# Patient Record
Sex: Male | Born: 1985
Health system: Southern US, Community
[De-identification: ages and names within clinical notes are randomized; demographics above are authoritative.]

## PROBLEM LIST (undated history)

## (undated) DIAGNOSIS — I639 Cerebral infarction, unspecified: Secondary | ICD-10-CM

## (undated) HISTORY — PX: FRACTURE SURGERY: SHX138

---

## 2017-11-14 ENCOUNTER — Encounter (HOSPITAL_COMMUNITY): Payer: Self-pay

## 2017-11-14 ENCOUNTER — Ambulatory Visit (HOSPITAL_COMMUNITY)
Admission: EM | Admit: 2017-11-14 | Discharge: 2017-11-14 | Disposition: A | Discharge status: Home / Self Care | Payer: Self-pay | Attending: Family Medicine | Admitting: Family Medicine

## 2017-11-14 DIAGNOSIS — F17210 Nicotine dependence, cigarettes, uncomplicated: Secondary | ICD-10-CM | POA: Insufficient documentation

## 2017-11-14 DIAGNOSIS — R369 Urethral discharge, unspecified: Secondary | ICD-10-CM | POA: Insufficient documentation

## 2017-11-14 DIAGNOSIS — Z202 Contact with and (suspected) exposure to infections with a predominantly sexual mode of transmission: Secondary | ICD-10-CM | POA: Insufficient documentation

## 2017-11-14 MED ORDER — LIDOCAINE HCL (PF) 1 % IJ SOLN
INTRAMUSCULAR | Status: AC
  Filled 2017-11-14: qty 2

## 2017-11-14 MED ORDER — AZITHROMYCIN 250 MG PO TABS
ORAL_TABLET | ORAL | Status: AC
  Filled 2017-11-14: qty 4

## 2017-11-14 MED ORDER — CEFTRIAXONE SODIUM 250 MG IJ SOLR
INTRAMUSCULAR | Status: AC
  Filled 2017-11-14: qty 250

## 2017-11-14 MED ORDER — AZITHROMYCIN 250 MG PO TABS
1000.0000 mg | ORAL_TABLET | Freq: Once | ORAL | Status: AC
Start: 2017-11-14 — End: 2017-11-14
  Administered 2017-11-14: 1000 mg via ORAL

## 2017-11-14 MED ORDER — CEFTRIAXONE SODIUM 250 MG IJ SOLR
250.0000 mg | Freq: Once | INTRAMUSCULAR | Status: AC
  Administered 2017-11-14: 250 mg via INTRAMUSCULAR

## 2017-11-14 NOTE — ED Provider Notes (Signed)
[facility]    CSN: [id number] Arrival date & time: 11/14/17  1811     History   Chief Complaint Chief Complaint  Patient presents with  . Exposure to STD    HPI Curtis Beltran is a 32 y.o. male.   32 year old male comes in for penile discharge and STD screening.  States he has a new partner, and this morning noticed some penile discharge with "tingling" after urination.  Denies fever, chills, night sweats.  Denies abdominal pain, nausea, vomiting.  Denies urinary frequency, hematuria.  Denies penile lesion/sore, testicular swelling, pain.     History reviewed. No pertinent past medical history.  There are no active problems to display for this patient.   History reviewed. No pertinent surgical history.     Home Medications    Prior to Admission medications   Not on File    Family History Family History  Problem Relation Age of Onset  . Healthy Mother     Social History Social History   Tobacco Use  . Smoking status: Light Tobacco Smoker  . Smokeless tobacco: Never Used  Substance Use Topics  . Alcohol use: Never    Frequency: Never  . Drug use: Never     Allergies   Patient has no allergy information on record.   Review of Systems Review of Systems  Reason unable to perform ROS: See HPI as above.     Physical Exam Triage Vital Signs ED Triage Vitals  Enc Vitals Group     BP 11/14/17 1832 (!) 140/97     Pulse Rate 11/14/17 1832 70     Resp 11/14/17 1832 19     Temp 11/14/17 1832 98.2 F (36.8 C)     Temp Source 11/14/17 1832 Oral     SpO2 11/14/17 1832 100 %     Weight --      Height --      Head Circumference --      Peak Flow --      Pain Score 11/14/17 1829 2     Pain Loc --      Pain Edu? --      Excl. in GC? --    No data found.  Updated Vital Signs BP (!) 140/97 (BP Location: Right Arm)   Pulse 70   Temp 98.2 F (36.8 C) (Oral)   Resp 19   SpO2 100%   Physical Exam  Constitutional: He is oriented to  person, place, and time. He appears well-developed and well-nourished. No distress.  HENT:  Head: Normocephalic and atraumatic.  Eyes: Pupils are equal, round, and reactive to light. Conjunctivae are normal.  Neurological: He is alert and oriented to person, place, and time.  Skin: He is not diaphoretic.     UC Treatments / Results  Labs (all labs ordered are listed, but only abnormal results are displayed) Labs Reviewed  URINE CYTOLOGY ANCILLARY ONLY    EKG None  Radiology No results found.  Procedures Procedures (including critical care time)  Medications Ordered in UC Medications  azithromycin (ZITHROMAX) tablet 1,000 mg (1,000 mg Oral Given 11/14/17 1921)  cefTRIAXone (ROCEPHIN) injection 250 mg (250 mg Intramuscular Given 11/14/17 1927)    Initial Impression / Assessment and Plan / UC Course  I have reviewed the triage vital signs and the nursing notes.  Pertinent labs & imaging results that were available during my care of the patient were reviewed by me and considered in my medical decision making (see chart for  details).    Patient was treated empirically for gonorrhea, chlamydia. Patient declined HIV/syphilis testing. Azithromycin and Rocephin given in office today. Cytology sent, patient will be contacted with any positive results that require additional treatment. Patient to refrain from sexual activity for the next 7 days. Return precautions given.   Final Clinical Impressions(s) / UC Diagnoses   Final diagnoses:  Penile discharge    ED Prescriptions    None        Belinda FisherYu, Amy V, PA-C 11/14/17 2002

## 2017-11-14 NOTE — Discharge Instructions (Signed)
You were treated empirically for gonorrhea and chlamydia.  Azithromycin 1g by mouth and Rocephin 250mg  injection given in office today. Cytology sent, you will be contacted with any positive results that requires further treatment. Refrain from sexual activity and alcohol use for the next 7 days. Monitor for any worsening of symptoms, fever, abdominal pain, nausea, vomiting, penile lesion/sore, testicular swelling or pain to follow up for reevaluation.

## 2017-11-14 NOTE — ED Triage Notes (Signed)
STD Screening

## 2017-11-15 LAB — URINE CYTOLOGY ANCILLARY ONLY
Chlamydia: NEGATIVE
Neisseria Gonorrhea: POSITIVE — AB
TRICH (WINDOWPATH): NEGATIVE

## 2017-11-17 ENCOUNTER — Telehealth (HOSPITAL_COMMUNITY): Payer: Self-pay

## 2017-11-17 NOTE — Telephone Encounter (Signed)
Test for gonorrhea was positive. This was treated at the urgent care visit with IM rocephin 250mg and po zithromax 1g. Pt called regarding test results, instructed patient to refrain from sexual intercourse for 7 days after treatment to give the medicine time to work. Sexual partners need to be notified and tested/treated. Condoms may reduce risk of reinfection. Recheck or followup with PCP for further evaluation if symptoms are not improving. Answered all patient questions. [facility] notified.  

## 2020-03-02 ENCOUNTER — Inpatient Hospital Stay (HOSPITAL_COMMUNITY)
Admission: EM | Admit: 2020-03-02 | Discharge: 2020-03-05 | DRG: 062 | Disposition: A | Discharge status: Home / Self Care | Payer: Self-pay | Attending: Neurology | Admitting: Neurology

## 2020-03-02 ENCOUNTER — Encounter (HOSPITAL_COMMUNITY): Payer: Self-pay | Admitting: Radiology

## 2020-03-02 ENCOUNTER — Emergency Department (HOSPITAL_COMMUNITY): Payer: Self-pay

## 2020-03-02 ENCOUNTER — Encounter (HOSPITAL_COMMUNITY)
Admission: EM | Disposition: A | Discharge status: Home / Self Care | Payer: Self-pay | Source: Home / Self Care | Attending: Neurology

## 2020-03-02 ENCOUNTER — Inpatient Hospital Stay (HOSPITAL_COMMUNITY): Payer: Self-pay

## 2020-03-02 ENCOUNTER — Emergency Department (HOSPITAL_COMMUNITY): Payer: Self-pay | Admitting: Anesthesiology

## 2020-03-02 DIAGNOSIS — G8194 Hemiplegia, unspecified affecting left nondominant side: Secondary | ICD-10-CM | POA: Diagnosis present

## 2020-03-02 DIAGNOSIS — Z20822 Contact with and (suspected) exposure to covid-19: Secondary | ICD-10-CM | POA: Diagnosis present

## 2020-03-02 DIAGNOSIS — I10 Essential (primary) hypertension: Secondary | ICD-10-CM | POA: Diagnosis present

## 2020-03-02 DIAGNOSIS — F1290 Cannabis use, unspecified, uncomplicated: Secondary | ICD-10-CM | POA: Diagnosis present

## 2020-03-02 DIAGNOSIS — Z9282 Status post administration of tPA (rtPA) in a different facility within the last 24 hours prior to admission to current facility: Secondary | ICD-10-CM

## 2020-03-02 DIAGNOSIS — I639 Cerebral infarction, unspecified: Principal | ICD-10-CM

## 2020-03-02 DIAGNOSIS — I63411 Cerebral infarction due to embolism of right middle cerebral artery: Principal | ICD-10-CM | POA: Diagnosis present

## 2020-03-02 DIAGNOSIS — E785 Hyperlipidemia, unspecified: Secondary | ICD-10-CM | POA: Diagnosis present

## 2020-03-02 DIAGNOSIS — R29709 NIHSS score 9: Secondary | ICD-10-CM | POA: Diagnosis present

## 2020-03-02 DIAGNOSIS — R001 Bradycardia, unspecified: Secondary | ICD-10-CM | POA: Diagnosis present

## 2020-03-02 DIAGNOSIS — F17200 Nicotine dependence, unspecified, uncomplicated: Secondary | ICD-10-CM | POA: Diagnosis present

## 2020-03-02 DIAGNOSIS — I6389 Other cerebral infarction: Secondary | ICD-10-CM

## 2020-03-02 DIAGNOSIS — E876 Hypokalemia: Secondary | ICD-10-CM | POA: Diagnosis present

## 2020-03-02 DIAGNOSIS — Z720 Tobacco use: Secondary | ICD-10-CM | POA: Diagnosis present

## 2020-03-02 LAB — COMPREHENSIVE METABOLIC PANEL
ALT: 14 U/L (ref 0–44)
AST: 19 U/L (ref 15–41)
Albumin: 3.5 g/dL (ref 3.5–5.0)
Alkaline Phosphatase: 55 U/L (ref 38–126)
Anion gap: 9 (ref 5–15)
BUN: 9 mg/dL (ref 6–20)
CO2: 25 mmol/L (ref 22–32)
Calcium: 8.8 mg/dL — ABNORMAL LOW (ref 8.9–10.3)
Chloride: 108 mmol/L (ref 98–111)
Creatinine, Ser: 1.12 mg/dL (ref 0.61–1.24)
GFR, Estimated: >60 mL/min (ref >60)
Glucose, Bld: 117 mg/dL — ABNORMAL HIGH (ref 70–99)
Potassium: 3.3 mmol/L — ABNORMAL LOW (ref 3.5–5.1)
Sodium: 142 mmol/L (ref 135–145)
Total Bilirubin: 0.5 mg/dL (ref 0.3–1.2)
Total Protein: 5.6 g/dL — ABNORMAL LOW (ref 6.5–8.1)

## 2020-03-02 LAB — DIFFERENTIAL
Abs Immature Granulocytes: 0.03 10*3/uL (ref 0.00–0.07)
Basophils Absolute: 0 10*3/uL (ref 0.0–0.1)
Basophils Relative: 0 %
Eosinophils Absolute: 0.2 10*3/uL (ref 0.0–0.5)
Eosinophils Relative: 2 %
Immature Granulocytes: 0 %
Lymphocytes Relative: 34 %
Lymphs Abs: 2.4 10*3/uL (ref 0.7–4.0)
Monocytes Absolute: 0.4 10*3/uL (ref 0.1–1.0)
Monocytes Relative: 6 %
Neutro Abs: 4 10*3/uL (ref 1.7–7.7)
Neutrophils Relative %: 58 %

## 2020-03-02 LAB — ETHANOL: Alcohol, Ethyl (B): <10 mg/dL (ref <10)

## 2020-03-02 LAB — ECHOCARDIOGRAM COMPLETE
AR max vel: 3.45 cm2
AV Area VTI: 3.07 cm2
AV Area mean vel: 3.34 cm2
AV Mean grad: 4 mmHg
AV Peak grad: 6.8 mmHg
Ao pk vel: 1.3 m/s
Area-P 1/2: 3.74 cm2
Height: 69 in
S' Lateral: 2.7 cm
Weight: 3180.8 oz

## 2020-03-02 LAB — I-STAT CHEM 8, ED
BUN: 10 mg/dL (ref 6–20)
Calcium, Ion: 1.16 mmol/L (ref 1.15–1.40)
Chloride: 105 mmol/L (ref 98–111)
Creatinine, Ser: 1.1 mg/dL (ref 0.61–1.24)
Glucose, Bld: 111 mg/dL — ABNORMAL HIGH (ref 70–99)
HCT: 48 % (ref 39.0–52.0)
Hemoglobin: 16.3 g/dL (ref 13.0–17.0)
Potassium: 3.2 mmol/L — ABNORMAL LOW (ref 3.5–5.1)
Sodium: 144 mmol/L (ref 135–145)
TCO2: 23 mmol/L (ref 22–32)

## 2020-03-02 LAB — CBG MONITORING, ED: Glucose-Capillary: 113 mg/dL — ABNORMAL HIGH (ref 70–99)

## 2020-03-02 LAB — CBC
HCT: 47.7 % (ref 39.0–52.0)
Hemoglobin: 15.5 g/dL (ref 13.0–17.0)
MCH: 27.8 pg (ref 26.0–34.0)
MCHC: 32.5 g/dL (ref 30.0–36.0)
MCV: 85.5 fL (ref 80.0–100.0)
Platelets: 225 10*3/uL (ref 150–400)
RBC: 5.58 MIL/uL (ref 4.22–5.81)
RDW: 15 % (ref 11.5–15.5)
WBC: 7 10*3/uL (ref 4.0–10.5)
nRBC: 0 % (ref 0.0–0.2)

## 2020-03-02 LAB — RESPIRATORY PANEL BY RT PCR (FLU A&B, COVID)
Influenza A by PCR: NEGATIVE
Influenza B by PCR: NEGATIVE
SARS Coronavirus 2 by RT PCR: NEGATIVE

## 2020-03-02 LAB — URINALYSIS, ROUTINE W REFLEX MICROSCOPIC
Bilirubin Urine: NEGATIVE
Glucose, UA: NEGATIVE mg/dL
Hgb urine dipstick: NEGATIVE
Ketones, ur: NEGATIVE mg/dL
Leukocytes,Ua: NEGATIVE
Nitrite: NEGATIVE
Protein, ur: NEGATIVE mg/dL
Specific Gravity, Urine: 1.01 (ref 1.005–1.030)
pH: 8 (ref 5.0–8.0)

## 2020-03-02 LAB — RAPID URINE DRUG SCREEN, HOSP PERFORMED
Amphetamines: NOT DETECTED
Barbiturates: NOT DETECTED
Benzodiazepines: NOT DETECTED
Cocaine: NOT DETECTED
Opiates: NOT DETECTED
Tetrahydrocannabinol: POSITIVE — AB

## 2020-03-02 LAB — GLUCOSE, CAPILLARY: Glucose-Capillary: 116 mg/dL — ABNORMAL HIGH (ref 70–99)

## 2020-03-02 LAB — APTT: aPTT: 29 seconds (ref 24–36)

## 2020-03-02 LAB — PROTIME-INR
INR: 0.9 (ref 0.8–1.2)
Prothrombin Time: 11.5 seconds (ref 11.4–15.2)

## 2020-03-02 LAB — MRSA PCR SCREENING: MRSA by PCR: NEGATIVE

## 2020-03-02 LAB — HIV ANTIBODY (ROUTINE TESTING W REFLEX): HIV Screen 4th Generation wRfx: NONREACTIVE

## 2020-03-02 SURGERY — IR WITH ANESTHESIA
Anesthesia: General

## 2020-03-02 MED ORDER — CLEVIDIPINE BUTYRATE 0.5 MG/ML IV EMUL: 0.0000 mg/h | INTRAVENOUS | Status: DC

## 2020-03-02 MED ORDER — STROKE: EARLY STAGES OF RECOVERY BOOK
Freq: Once | Status: AC
  Filled 2020-03-02: qty 1

## 2020-03-02 MED ORDER — HYDRALAZINE HCL 20 MG/ML IJ SOLN
5.0000 mg | INTRAMUSCULAR | Status: DC | PRN
  Administered 2020-03-02 (×2): 10 mg via INTRAVENOUS
  Filled 2020-03-02 (×2): qty 1

## 2020-03-02 MED ORDER — IOHEXOL 350 MG/ML SOLN
50.0000 mL | Freq: Once | INTRAVENOUS | Status: AC | PRN
  Administered 2020-03-02: 50 mL via INTRAVENOUS

## 2020-03-02 MED ORDER — ALTEPLASE (STROKE) FULL DOSE INFUSION
0.9000 mg/kg | Freq: Once | INTRAVENOUS | Status: AC
  Administered 2020-03-02: 81.2 mg via INTRAVENOUS
  Filled 2020-03-02: qty 100

## 2020-03-02 MED ORDER — PANTOPRAZOLE SODIUM 40 MG IV SOLR: 40.0000 mg | Freq: Every day | INTRAVENOUS | Status: DC

## 2020-03-02 MED ORDER — SENNOSIDES-DOCUSATE SODIUM 8.6-50 MG PO TABS
1.0000 | ORAL_TABLET | Freq: Every day | ORAL | Status: DC
  Filled 2020-03-02: qty 1

## 2020-03-02 MED ORDER — ACETAMINOPHEN 650 MG RE SUPP: 650.0000 mg | RECTAL | Status: DC | PRN

## 2020-03-02 MED ORDER — ACETAMINOPHEN 325 MG PO TABS
650.0000 mg | ORAL_TABLET | ORAL | Status: DC | PRN
  Administered 2020-03-04 – 2020-03-05 (×2): 650 mg via ORAL
  Filled 2020-03-02 (×2): qty 2

## 2020-03-02 MED ORDER — POTASSIUM CHLORIDE 10 MEQ/100ML IV SOLN
10.0000 meq | INTRAVENOUS | Status: AC
  Administered 2020-03-02 (×3): 10 meq via INTRAVENOUS
  Filled 2020-03-02 (×3): qty 100

## 2020-03-02 MED ORDER — STROKE: EARLY STAGES OF RECOVERY BOOK
Status: AC
  Filled 2020-03-02: qty 1

## 2020-03-02 MED ORDER — SODIUM CHLORIDE 0.9 % IV SOLN
50.0000 mL | Freq: Once | INTRAVENOUS | Status: AC
  Administered 2020-03-02: 50 mL via INTRAVENOUS

## 2020-03-02 MED ORDER — LABETALOL HCL 5 MG/ML IV SOLN: 10.0000 mg | INTRAVENOUS | Status: DC | PRN

## 2020-03-02 MED ORDER — PANTOPRAZOLE SODIUM 40 MG PO TBEC
40.0000 mg | DELAYED_RELEASE_TABLET | Freq: Every day | ORAL | Status: DC
  Administered 2020-03-02 – 2020-03-05 (×4): 40 mg via ORAL
  Filled 2020-03-02 (×4): qty 1

## 2020-03-02 MED ORDER — IOHEXOL 350 MG/ML SOLN
115.0000 mL | Freq: Once | INTRAVENOUS | Status: AC | PRN
  Administered 2020-03-02: 115 mL via INTRAVENOUS

## 2020-03-02 MED ORDER — ACETAMINOPHEN 160 MG/5ML PO SOLN: 650.0000 mg | ORAL | Status: DC | PRN

## 2020-03-02 MED ORDER — SODIUM CHLORIDE 0.9 % IV SOLN: INTRAVENOUS | Status: DC

## 2020-03-02 MED ORDER — CHLORHEXIDINE GLUCONATE CLOTH 2 % EX PADS
6.0000 | MEDICATED_PAD | Freq: Every day | CUTANEOUS | Status: DC
  Administered 2020-03-04 – 2020-03-05 (×2): 6 via TOPICAL

## 2020-03-02 NOTE — Progress Notes (Addendum)
Pharmacist Code Stroke Response  Notified to mix tPA at 0520 by Dr. Thomasena Edis Delivered tPA to RN at [phone]  tPA dose = 8.1mg  bolus over 1 minute followed by 73.1mg  for a total dose of 81.2mg  over 1 hour  Issues/delays encountered (if applicable): Needed second IV for CTA that was difficult to establish, needed more current BP but monitor in CT was dead, had to wait to return to room.  Vernard Gambles, PharmD, BCPS  03/02/20 5:34 AM

## 2020-03-02 NOTE — Progress Notes (Signed)
VASCULAR LAB    Bilateral lower extremity venous duplex has been performed.  See CV proc for preliminary results.   Evlyn Amason, RVT 03/02/2020, 10:49 AM

## 2020-03-02 NOTE — Progress Notes (Signed)
  Echocardiogram 2D Echocardiogram has been performed.  Curtis Beltran 03/02/2020, 4:36 PM

## 2020-03-02 NOTE — H&P (Signed)
Neurology H&P  CC: left sided weakness  History is obtained from: patient  HPI: Curtis Beltran is a 34 y.o. male right handed smoker no sig pmhx went to bed at 0100 and woke with left sided weakness.  He has never had this in the past.  Delay for tPA and CTA due to difficulty vascular access.  LKW: 0100 tpa given?: yes IR Thrombectomy? yes Modified Rankin Scale: 0-Completely asymptomatic and back to baseline post- stroke NIHSS: 9  ROS: A complete ROS was performed and is negative except as noted in the HPI.   No past medical history on file.  Family History  Problem Relation Age of Onset  . Healthy Mother     Social History:  reports that he has been smoking. He has never used smokeless tobacco. He reports that he does not drink alcohol and does not use drugs.   Prior to Admission medications   Not on File    Exam: Current vital signs: BP (!) 170/110   Wt 90.2 kg   Physical Exam  Constitutional: Appears well-developed and well-nourished.  Psych: Affect appropriate to situation Eyes: No scleral injection HENT: No OP obstrucion Head: Normocephalic.  Cardiovascular: Normal rate and regular rhythm.  Respiratory: Effort normal and breath sounds normal to anterior ascultation GI: Soft.  No distension. There is no tenderness.  Skin: WDI  Neuro: Mental Status: Patient is awake, alert, oriented to person, place, month, year, and situation. Patient is able to give a clear and coherent history. No signs of aphasia or neglect Cranial Nerves: II: Visual Fields are full. Pupils are equal, round, and reactive to light.  III,IV, VI: EOMI without ptosis or diploplia.  V: Facial sensation decreased on left. VII: Facial movement asymmetric on left.  VIII: hearing is intact to voice X: Uvula elevates symmetrically XI: Shoulder shrug is symmetric. XII: tongue is midline without atrophy or fasciculations.  Motor: Tone is normal. Bulk is normal. Left upper and lower  extremity flaccid weakness. Sensory: Sensation is symmetric to light touch and temperature in the arms and legs.  Deep Tendon Reflexes: 2+ and symmetric in the biceps and patellae.  Plantars: Toes mute on left/downgoing right. Cerebellar: FNF and HKS are intact bilaterally.   I have reviewed the images obtained: NCT head showed no acute ischemic changes aspects 10 CTA head and neck showed distal right M1 occlusion CTP no core infarct large territory right MCA penumbra no mismatch.  Assessment: Curtis Beltran is a 34 y.o. male no past medical history last normal 0100 and left flaccid weakness found to have distal right M1 occlusion. CTP no core infarct large territory right MCA penumbra no mismatch. tPA administered and discussed with nerointervention and patient was candidate for intervention.   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  *Delay for tPA and CTA due to difficult vascular access. *Delay for IR activation - Discussed benefits and risks of intervention for consent and patient became emotional requiring emotional support. He opted to only proceed after speaking with his son. Several attempts to call were made and ultimately he got through and subsequently consented.   Plan: - Almsot complete recovery post tPA. He will need a repeat CTA STAT to determine if complete recannulation and if he needs acute intervention or diagnostic angio tomorrow. - MRI brain without contrast. - Recommend TTE. - Recommend labs: HbA1c, lipid panel, TSH. - Recommend Statin if LDL > 70 - Hold antiplatelets for now. - Clopidogrel 75mg  daily for 3 weeks. - SBP  goal <180 in anticipation of possible intervention. - He will need ICU admission and post tPA/thrombectomy protocol.  This patient is critically ill and at significant risk of neurological worsening, death and care requires constant monitoring of vital signs, hemodynamics,respiratory and cardiac monitoring,  neurological assessment, discussion with family, other specialists and medical decision making of high complexity. I spent 75 minutes of neurocritical care time  in the care of  this patient. This was time spent independent of any time provided by nurse practitioner or PA.  Electronically signed by: Dr. Marisue Humble Pager: [phone] 03/02/2020, 6:02 AM

## 2020-03-02 NOTE — Code Documentation (Signed)
Responded to Code Stroke called at 0457 for L sided deficits s/p fall, LSN-0200. Pt arrived at 0506, NIH-9 for L sided paralysis and L sensory deficit, CBG-113 CT head-negative for acute changes. TPA started at 0543. CTA-Distal R M1 occlusion. CTP-penumbra-34cc with no core infarct. IR paged out at [facility]. Pt prepped and transported to IR [facility] at 0630.

## 2020-03-02 NOTE — ED Notes (Signed)
Patient taken to IR with RR RN and Stroke MD

## 2020-03-02 NOTE — ED Provider Notes (Signed)
[facility] EMERGENCY DEPARTMENT Provider Note   CSN: [id number] Arrival date & time: 03/02/20  2706     History Chief Complaint  Patient presents with  . Code Stroke    Curtis Beltran is a 34 y.o. male.  Presents to the emergency department as a code stroke.  Patient reports he woke up this morning, felt hot and got up to turn on the fan.  He reports that he fell to the ground immediately because the left side of his body was not working right.  He thinks that he is slurring his speech a little bit as well.  He has noticed some numbness on the left side.  Patient went to bed between 1 and 2 AM.        No past medical history on file.  There are no problems to display for this patient.   No past surgical history on file.     Family History  Problem Relation Age of Onset  . Healthy Mother     Social History   Tobacco Use  . Smoking status: Light Tobacco Smoker  . Smokeless tobacco: Never Used  Vaping Use  . Vaping Use: Never used  Substance Use Topics  . Alcohol use: Never  . Drug use: Never    Home Medications Prior to Admission medications   Not on File    Allergies    Patient has no allergy information on record.  Review of Systems   Review of Systems  Neurological: Positive for weakness and numbness.  All other systems reviewed and are negative.   Physical Exam Updated Vital Signs BP (!) 170/110   Wt 90.2 kg   Physical Exam Vitals and nursing note reviewed.  Constitutional:      General: He is not in acute distress.    Appearance: Normal appearance. He is well-developed.  HENT:     Head: Normocephalic and atraumatic.     Right Ear: Hearing normal.     Left Ear: Hearing normal.     Nose: Nose normal.  Eyes:     Conjunctiva/sclera: Conjunctivae normal.     Pupils: Pupils are equal, round, and reactive to light.  Cardiovascular:     Rate and Rhythm: Regular rhythm.     Heart sounds: S1 normal and S2 normal. No  murmur heard.  No friction rub. No gallop.   Pulmonary:     Effort: Pulmonary effort is normal. No respiratory distress.     Breath sounds: Normal breath sounds.  Chest:     Chest wall: No tenderness.  Abdominal:     General: Bowel sounds are normal.     Palpations: Abdomen is soft.     Tenderness: There is no abdominal tenderness. There is no guarding or rebound. Negative signs include Murphy's sign and McBurney's sign.     Hernia: No hernia is present.  Musculoskeletal:        General: Normal range of motion.     Cervical back: Normal range of motion and neck supple.  Skin:    General: Skin is warm and dry.     Findings: No rash.  Neurological:     Mental Status: He is alert and oriented to person, place, and time.     GCS: GCS eye subscore is 4. GCS verbal subscore is 5. GCS motor subscore is 6.     Cranial Nerves: No cranial nerve deficit.     Sensory: Sensory deficit (Left hemiparesis) present.     Motor:  Weakness (Left hemiparesis) present.     Coordination: Coordination normal.  Psychiatric:        Speech: Speech normal.        Behavior: Behavior normal.        Thought Content: Thought content normal.     ED Results / Procedures / Treatments   Labs (all labs ordered are listed, but only abnormal results are displayed) Labs Reviewed  COMPREHENSIVE METABOLIC PANEL - Abnormal; Notable for the following components:      Result Value   Potassium 3.3 (*)    Glucose, Bld 117 (*)    Calcium 8.8 (*)    Total Protein 5.6 (*)    All other components within normal limits  I-STAT CHEM 8, ED - Abnormal; Notable for the following components:   Potassium 3.2 (*)    Glucose, Bld 111 (*)    All other components within normal limits  CBG MONITORING, ED - Abnormal; Notable for the following components:   Glucose-Capillary 113 (*)    All other components within normal limits  ETHANOL  PROTIME-INR  APTT  CBC  DIFFERENTIAL  RAPID URINE DRUG SCREEN, HOSP PERFORMED  URINALYSIS,  ROUTINE W REFLEX MICROSCOPIC    EKG None  Radiology CT CEREBRAL PERFUSION W CONTRAST  Result Date: 03/02/2020 CLINICAL DATA:  Left-sided weakness EXAM: CT ANGIOGRAPHY HEAD AND NECK CT PERFUSION BRAIN TECHNIQUE: Multidetector CT imaging of the head and neck was performed using the standard protocol during bolus administration of intravenous contrast. Multiplanar CT image reconstructions and MIPs were obtained to evaluate the vascular anatomy. Carotid stenosis measurements (when applicable) are obtained utilizing NASCET criteria, using the distal internal carotid diameter as the denominator. Multiphase CT imaging of the brain was performed following IV bolus contrast injection. Subsequent parametric perfusion maps were calculated using RAPID software. CONTRAST:  OMNIPAQUE IOHEXOL 350 MG/ML SOLN COMPARISON:  None. FINDINGS: CTA NECK FINDINGS SKELETON: There is no bony spinal canal stenosis. No lytic or blastic lesion. OTHER NECK: Normal pharynx, larynx and major salivary glands. No cervical lymphadenopathy. Unremarkable thyroid gland. UPPER CHEST: No pneumothorax or pleural effusion. No nodules or masses. AORTIC ARCH: There is no calcific atherosclerosis of the aortic arch. There is no aneurysm, dissection or hemodynamically significant stenosis of the visualized portion of the aorta. Conventional 3 vessel aortic branching pattern. The visualized proximal subclavian arteries are widely patent. RIGHT CAROTID SYSTEM: Normal without aneurysm, dissection or stenosis. LEFT CAROTID SYSTEM: Normal without aneurysm, dissection or stenosis. VERTEBRAL ARTERIES: Left dominant configuration. Both origins are clearly patent. There is no dissection, occlusion or flow-limiting stenosis to the skull base (V1-V3 segments). CTA HEAD FINDINGS POSTERIOR CIRCULATION: --Vertebral arteries: Normal V4 segments. --Inferior cerebellar arteries: Normal. --Basilar artery: Normal. --Superior cerebellar arteries: Normal. --Posterior  cerebral arteries (PCA): Normal. ANTERIOR CIRCULATION: --Intracranial internal carotid arteries: Normal. --Anterior cerebral arteries (ACA): Normal. Both A1 segments are present. Patent anterior communicating artery (a-comm). --Middle cerebral arteries (MCA): Distal right M1 segment occlusion. Good collateral flow in the right MCA territory. Normal left MCA. VENOUS SINUSES: As permitted by contrast timing, patent. ANATOMIC VARIANTS: None Review of the MIP images confirms the above findings. CT Brain Perfusion Findings: ASPECTS: 10 CBF (<30%) Volume: 84mL Perfusion (Tmax>6.0s) volume: 12mL Mismatch Volume: 16mL Infarction Location:No complete infarction. Ischemic penumbra in the right MCA territory. IMPRESSION: 1. Emergent large vessel occlusion of the distal right M1 segment. Good collateral flow in the right MCA territory. 2. Ischemic penumbra measuring 34 mL in the right MCA territory without complete infarction. These results  were communicated to Marisue Humble at 6:02 am on 03/02/2020 by text page via the [facility] messaging system. Electronically Signed   By: Deatra Robinson M.D.   On: 03/02/2020 06:02   CT HEAD CODE STROKE WO CONTRAST  Result Date: 03/02/2020 CLINICAL DATA:  Code stroke.  Left-sided weakness EXAM: CT HEAD WITHOUT CONTRAST TECHNIQUE: Contiguous axial images were obtained from the base of the skull through the vertex without intravenous contrast. COMPARISON:  None. FINDINGS: Brain: There is no mass, hemorrhage or extra-axial collection. The size and configuration of the ventricles and extra-axial CSF spaces are normal. The brain parenchyma is normal, without evidence of acute or chronic infarction. Vascular: No abnormal hyperdensity of the major intracranial arteries or dural venous sinuses. No intracranial atherosclerosis. Skull: The visualized skull base, calvarium and extracranial soft tissues are normal. Sinuses/Orbits: No fluid levels or advanced mucosal thickening of the visualized paranasal  sinuses. No mastoid or middle ear effusion. The orbits are normal. ASPECTS [facility] Stroke Program Early CT Score) - Ganglionic level infarction (caudate, lentiform nuclei, internal capsule, insula, M1-M3 cortex): 7 - Supraganglionic infarction (M4-M6 cortex): 3 Total score (0-10 with 10 being normal): 10 IMPRESSION: 1. Normal head CT. 2. ASPECTS is 10. These results were communicated to Marisue Humble at 5:25 am on 03/02/2020 by text page via the [facility] messaging system. Electronically Signed   By: Deatra Robinson M.D.   On: 03/02/2020 05:25   CT ANGIO HEAD CODE STROKE  Result Date: 03/02/2020 CLINICAL DATA:  Left-sided weakness EXAM: CT ANGIOGRAPHY HEAD AND NECK CT PERFUSION BRAIN TECHNIQUE: Multidetector CT imaging of the head and neck was performed using the standard protocol during bolus administration of intravenous contrast. Multiplanar CT image reconstructions and MIPs were obtained to evaluate the vascular anatomy. Carotid stenosis measurements (when applicable) are obtained utilizing NASCET criteria, using the distal internal carotid diameter as the denominator. Multiphase CT imaging of the brain was performed following IV bolus contrast injection. Subsequent parametric perfusion maps were calculated using RAPID software. CONTRAST:  OMNIPAQUE IOHEXOL 350 MG/ML SOLN COMPARISON:  None. FINDINGS: CTA NECK FINDINGS SKELETON: There is no bony spinal canal stenosis. No lytic or blastic lesion. OTHER NECK: Normal pharynx, larynx and major salivary glands. No cervical lymphadenopathy. Unremarkable thyroid gland. UPPER CHEST: No pneumothorax or pleural effusion. No nodules or masses. AORTIC ARCH: There is no calcific atherosclerosis of the aortic arch. There is no aneurysm, dissection or hemodynamically significant stenosis of the visualized portion of the aorta. Conventional 3 vessel aortic branching pattern. The visualized proximal subclavian arteries are widely patent. RIGHT CAROTID SYSTEM: Normal  without aneurysm, dissection or stenosis. LEFT CAROTID SYSTEM: Normal without aneurysm, dissection or stenosis. VERTEBRAL ARTERIES: Left dominant configuration. Both origins are clearly patent. There is no dissection, occlusion or flow-limiting stenosis to the skull base (V1-V3 segments). CTA HEAD FINDINGS POSTERIOR CIRCULATION: --Vertebral arteries: Normal V4 segments. --Inferior cerebellar arteries: Normal. --Basilar artery: Normal. --Superior cerebellar arteries: Normal. --Posterior cerebral arteries (PCA): Normal. ANTERIOR CIRCULATION: --Intracranial internal carotid arteries: Normal. --Anterior cerebral arteries (ACA): Normal. Both A1 segments are present. Patent anterior communicating artery (a-comm). --Middle cerebral arteries (MCA): Distal right M1 segment occlusion. Good collateral flow in the right MCA territory. Normal left MCA. VENOUS SINUSES: As permitted by contrast timing, patent. ANATOMIC VARIANTS: None Review of the MIP images confirms the above findings. CT Brain Perfusion Findings: ASPECTS: 10 CBF (<30%) Volume: 0mL Perfusion (Tmax>6.0s) volume: 34mL Mismatch Volume: 34mL Infarction Location:No complete infarction. Ischemic penumbra in the right MCA territory. IMPRESSION: 1. Emergent large  vessel occlusion of the distal right M1 segment. Good collateral flow in the right MCA territory. 2. Ischemic penumbra measuring 34 mL in the right MCA territory without complete infarction. These results were communicated to Marisue HumbleHunter Collins at 6:02 am on 03/02/2020 by text page [facility] the [facility] system. Electronically Signed   By: Deatra RobinsonKevin  Herman M.D.   On: 03/02/2020 06:02   CT ANGIO NECK CODE STROKE  Result Date: 03/02/2020 CLINICAL DATA:  Left-sided weakness EXAM: CT ANGIOGRAPHY HEAD AND NECK CT PERFUSION BRAIN TECHNIQUE: Multidetector CT imaging of the head and neck was performed using the standard protocol during bolus administration of intravenous contrast. Multiplanar CT image reconstructions and  MIPs were obtained to evaluate the vascular anatomy. Carotid stenosis measurements (when applicable) are obtained utilizing NASCET criteria, using the distal internal carotid diameter as the denominator. Multiphase CT imaging of the brain was performed following IV bolus contrast injection. Subsequent parametric perfusion maps were calculated using RAPID software. CONTRAST:  115mL OMNIPAQUE IOHEXOL 350 MG/ML SOLN COMPARISON:  None. FINDINGS: CTA NECK FINDINGS SKELETON: There is no bony spinal canal stenosis. No lytic or blastic lesion. OTHER NECK: Normal pharynx, larynx and major salivary glands. No cervical lymphadenopathy. Unremarkable thyroid gland. UPPER CHEST: No pneumothorax or pleural effusion. No nodules or masses. AORTIC ARCH: There is no calcific atherosclerosis of the aortic arch. There is no aneurysm, dissection or hemodynamically significant stenosis of the visualized portion of the aorta. Conventional 3 vessel aortic branching pattern. The visualized proximal subclavian arteries are widely patent. RIGHT CAROTID SYSTEM: Normal without aneurysm, dissection or stenosis. LEFT CAROTID SYSTEM: Normal without aneurysm, dissection or stenosis. VERTEBRAL ARTERIES: Left dominant configuration. Both origins are clearly patent. There is no dissection, occlusion or flow-limiting stenosis to the skull base (V1-V3 segments). CTA HEAD FINDINGS POSTERIOR CIRCULATION: --Vertebral arteries: Normal V4 segments. --Inferior cerebellar arteries: Normal. --Basilar artery: Normal. --Superior cerebellar arteries: Normal. --Posterior cerebral arteries (PCA): Normal. ANTERIOR CIRCULATION: --Intracranial internal carotid arteries: Normal. --Anterior cerebral arteries (ACA): Normal. Both A1 segments are present. Patent anterior communicating artery (a-comm). --Middle cerebral arteries (MCA): Distal right M1 segment occlusion. Good collateral flow in the right MCA territory. Normal left MCA. VENOUS SINUSES: As permitted by contrast  timing, patent. ANATOMIC VARIANTS: None Review of the MIP images confirms the above findings. CT Brain Perfusion Findings: ASPECTS: 10 CBF (<30%) Volume: 0mL Perfusion (Tmax>6.0s) volume: 34mL Mismatch Volume: 34mL Infarction Location:No complete infarction. Ischemic penumbra in the right MCA territory. IMPRESSION: 1. Emergent large vessel occlusion of the distal right M1 segment. Good collateral flow in the right MCA territory. 2. Ischemic penumbra measuring 34 mL in the right MCA territory without complete infarction. These results were communicated to Marisue HumbleHunter Collins at 6:02 am on 03/02/2020 by text page via the [facility] messaging system. Electronically Signed   By: Deatra RobinsonKevin  Herman M.D.   On: 03/02/2020 06:02    Procedures Procedures (including critical care time)  Medications Ordered in ED Medications  alteplase (ACTIVASE) 1 mg/mL infusion 81.2 mg (has no administration in time range)    Followed by  0.9 %  sodium chloride infusion (has no administration in time range)  iohexol (OMNIPAQUE) 350 MG/ML injection 115 mL (115 mLs Intravenous Contrast Given 03/02/20 0527)    ED Course  I have reviewed the triage vital signs and the nursing notes.  Pertinent labs & imaging results that were available during my care of the patient were reviewed by me and considered in my medical decision making (see chart for details).    MDM  Rules/Calculators/A&P                          Patient presents to the emergency department as a code stroke.  Patient reports that he went to bed between 1 and 2 AM.  He woke up this morning and had left-sided weakness.  Examination on arrival reveals fairly significant weakness of the left upper and left lower extremity.  Patient underwent immediate evaluation by myself and neurology.  Patient administered TPA for stroke.  He will be admitted to neuro ICU by neurology.  CRITICAL CARE Performed by: Gilda Crease   Total critical care time: 35 minutes  Critical care  time was exclusive of separately billable procedures and treating other patients.  Critical care was necessary to treat or prevent imminent or life-threatening deterioration.  Critical care was time spent personally by me on the following activities: development of treatment plan with patient and/or surrogate as well as nursing, discussions with consultants, evaluation of patient's response to treatment, examination of patient, obtaining history from patient or surrogate, ordering and performing treatments and interventions, ordering and review of laboratory studies, ordering and review of radiographic studies, pulse oximetry and re-evaluation of patient's condition.   Final Clinical Impression(s) / ED Diagnoses Final diagnoses:  Cerebrovascular accident (CVA), unspecified mechanism [facility])    Rx / DC Orders ED Discharge Orders    None       Shaya Reddick, Canary Brim, MD 03/02/20 [phone]

## 2020-03-02 NOTE — Progress Notes (Signed)
STROKE TEAM PROGRESS NOTE   INTERVAL HISTORY His significant other is at the bedside.  Pt lying in bed, AAO x 3, left sided weakness now resolved. Repeat CTA head showed recanalized right MCA. BP 152/105, will add hydralazine PRN and cleviprex for BP control PRN. He passed swallow screen, will start diet.   OBJECTIVE Vitals:   03/02/20 0720 03/02/20 0730 03/02/20 0800 03/02/20 0900  BP: (!) 167/92 (!) 163/91 (!) 149/92 (!) 163/97  Pulse: 63 (!) 58 (!) 57 (!) 50  Resp: 18 18  15   SpO2: 100% 100% 100% 100%  Weight:        CBC:  Recent Labs  Lab 03/02/20 0513 03/02/20 0518  WBC 7.0  --   NEUTROABS 4.0  --   HGB 15.5 16.3  HCT 47.7 48.0  MCV 85.5  --   PLT 225  --     Basic Metabolic Panel:  Recent Labs  Lab 03/02/20 0513 03/02/20 0518  NA 142 144  K 3.3* 3.2*  CL 108 105  CO2 25  --   GLUCOSE 117* 111*  BUN 9 10  CREATININE 1.12 1.10  CALCIUM 8.8*  --     Lipid Panel: No results found for: CHOL, TRIG, HDL, CHOLHDL, VLDL, LDLCALC HgbA1c: No results found for: HGBA1C Urine Drug Screen: No results found for: LABOPIA, COCAINSCRNUR, LABBENZ, AMPHETMU, THCU, LABBARB  Alcohol Level     Component Value Date/Time   ETH <10 03/02/2020 0513    IMAGING  CT ANGIO HEAD CODE STROKE CT CEREBRAL PERFUSION W CONTRAST CT ANGIO NECK CODE STROKE 03/02/2020 IMPRESSION:  1. Emergent large vessel occlusion of the distal right M1 segment. Good collateral flow in the right MCA territory.  2. Ischemic penumbra measuring 34 mL in the right MCA territory without complete infarction.   CT HEAD CODE STROKE WO CONTRAST 03/02/2020 IMPRESSION:  1. Normal head CT.  2. ASPECTS is 10.   CT ANGIO HEAD CODE STROKE 03/02/2020 IMPRESSION:  Recanalization of distal right M1 MCA with mild residual narrowing. Improved flow within the right MCA territory.   MRI Brain WO Contrast - pending  Transthoracic Echocardiogram  00/00/2021 Pending   PHYSICAL EXAM  Pulse Rate:  [50-76] 55  (10/31 1000) Resp:  [15-20] 17 (10/31 1000) BP: (149-180)/(91-125) 149/113 (10/31 1000) SpO2:  [99 %-100 %] 100 % (10/31 1000) Weight:  [90.2 kg] 90.2 kg (10/31 0500)  General - Well nourished, well developed, in no apparent distress.  Ophthalmologic - fundi not visualized due to noncooperation.  Cardiovascular - Regular rhythm and rate.  Mental Status -  Level of arousal and orientation to time, place, and person were intact. Language including expression, naming, repetition, comprehension was assessed and found intact. Fund of Knowledge was assessed and was intact.  Cranial Nerves II - XII - II - Visual field intact OU. III, IV, VI - Extraocular movements intact. V - Facial sensation intact bilaterally. VII - Facial movement intact bilaterally. VIII - Hearing & vestibular intact bilaterally. X - Palate elevates symmetrically. XI - Chin turning & shoulder shrug intact bilaterally. XII - Tongue protrusion intact.  Motor Strength - The patient's strength was normal in all extremities and pronator drift was absent.  Bulk was normal and fasciculations were absent.   Motor Tone - Muscle tone was assessed at the neck and appendages and was normal.  Reflexes - The patient's reflexes were symmetrical in all extremities and he had no pathological reflexes.  Sensory - Light touch, temperature/pinprick were assessed and were  symmetrical.    Coordination - The patient had normal movements in the hands with no ataxia or dysmetria.  Tremor was absent.  Gait and Station - deferred.   ASSESSMENT/PLAN Mr. Curtis Beltran is a 34 y.o. male with history of tobacco use who went to bed at 0100 and woke with left sided weakness. The patient received IV t-PA Sunday 03/02/20 at 5:45 AM. Pt consented to thrombectomy but Rt M1 recanalized.   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  CT Head - Normal head CT. ASPECTS is 10.   MRI  head - pending  CTA H&N - Emergent large vessel occlusion of the distal right M1 segment. Good collateral flow in the right MCA territory.  CT Perfusion - Ischemic penumbra measuring 34 mL in the right MCA territory without complete infarction.   CTA Head repeat - Recanalization of distal right M1 MCA with mild residual narrowing. Improved flow within the right MCA territory.  LE Venous Dopplers - pending  2D Echo - pending  Consider TEE to further evaluate cardiac source  [employer] Virus 2 - negative  LDL - pending  HgbA1c - pending  UDS - pending  Hypercoagulable work up pending  VTE prophylaxis - SCDs  No antithrombotic prior to admission, now on No antithrombotic within 24h of s/p tPA  Patient counseled to be compliant with his antithrombotic medications  Ongoing aggressive stroke risk factor management  Therapy recommendations:  pending  Disposition:  Pending  Hypertension  Home BP meds: none   Stable on the high end  Hydralazine PRN  cleviprex PRN . BP goal < 180/105 post tPA  . Long-term BP goal normotensive  Tobacco abuse  Current smoker  Smoking cessation counseling provided  Pt is willing to quit  Other Stroke Risk Factors    Other Active Problems  Code status - Full code  Hypokalemia - 3.3 - supplement  Bradycardia 50s  Hospital day # 0  This patient is critically ill due to acute stroke with LVO, status post TPA, hypertension and at significant risk of neurological worsening, death form recurrent stroke, hemorrhagic conversion, hemorrhage from TPA, seizure, hypertensive encephalopathy. This patient's care requires constant monitoring of vital signs, hemodynamics, respiratory and cardiac monitoring, review of multiple databases, neurological assessment, discussion with family, other specialists and medical decision making of high complexity. I spent 30 minutes of neurocritical care time in the care of this patient. I had long  discussion with patient and his significant other at bedside, updated pt current condition, treatment plan and potential prognosis, and answered all the questions.  They expressed understanding and appreciation.    Marvel Plan, MD PhD Stroke Neurology 03/02/2020 12:33 PM  To contact Stroke Continuity provider, please refer to [email or web address]. After hours, contact General Neurology

## 2020-03-03 ENCOUNTER — Other Ambulatory Visit: Payer: Self-pay

## 2020-03-03 ENCOUNTER — Inpatient Hospital Stay (HOSPITAL_COMMUNITY): Payer: Self-pay

## 2020-03-03 ENCOUNTER — Encounter (HOSPITAL_COMMUNITY): Payer: Self-pay | Admitting: Neurology

## 2020-03-03 DIAGNOSIS — I63511 Cerebral infarction due to unspecified occlusion or stenosis of right middle cerebral artery: Secondary | ICD-10-CM

## 2020-03-03 DIAGNOSIS — E7800 Pure hypercholesterolemia, unspecified: Secondary | ICD-10-CM

## 2020-03-03 LAB — RPR: RPR Ser Ql: NONREACTIVE

## 2020-03-03 LAB — BASIC METABOLIC PANEL
Anion gap: 8 (ref 5–15)
BUN: 7 mg/dL (ref 6–20)
CO2: 23 mmol/L (ref 22–32)
Calcium: 8.5 mg/dL — ABNORMAL LOW (ref 8.9–10.3)
Chloride: 109 mmol/L (ref 98–111)
Creatinine, Ser: 1.11 mg/dL (ref 0.61–1.24)
GFR, Estimated: >60 mL/min (ref >60)
Glucose, Bld: 95 mg/dL (ref 70–99)
Potassium: 3.7 mmol/L (ref 3.5–5.1)
Sodium: 140 mmol/L (ref 135–145)

## 2020-03-03 LAB — CBC
HCT: 45.4 % (ref 39.0–52.0)
Hemoglobin: 14.8 g/dL (ref 13.0–17.0)
MCH: 27.4 pg (ref 26.0–34.0)
MCHC: 32.6 g/dL (ref 30.0–36.0)
MCV: 84.1 fL (ref 80.0–100.0)
Platelets: 221 10*3/uL (ref 150–400)
RBC: 5.4 MIL/uL (ref 4.22–5.81)
RDW: 15.1 % (ref 11.5–15.5)
WBC: 6.6 10*3/uL (ref 4.0–10.5)
nRBC: 0 % (ref 0.0–0.2)

## 2020-03-03 LAB — TSH: TSH: 2.379 u[IU]/mL (ref 0.350–4.500)

## 2020-03-03 LAB — LIPID PANEL
Cholesterol: 205 mg/dL — ABNORMAL HIGH (ref 0–200)
HDL: 37 mg/dL — ABNORMAL LOW (ref >40)
LDL Cholesterol: 142 mg/dL — ABNORMAL HIGH (ref 0–99)
Total CHOL/HDL Ratio: 5.5 RATIO
Triglycerides: 130 mg/dL (ref <150)
VLDL: 26 mg/dL (ref 0–40)

## 2020-03-03 LAB — VITAMIN B12: Vitamin B-12: 212 pg/mL (ref 180–914)

## 2020-03-03 LAB — HEMOGLOBIN A1C
Hgb A1c MFr Bld: 5.9 % — ABNORMAL HIGH (ref 4.8–5.6)
Mean Plasma Glucose: 122.63 mg/dL

## 2020-03-03 MED ORDER — CLOPIDOGREL BISULFATE 75 MG PO TABS
75.0000 mg | ORAL_TABLET | Freq: Every day | ORAL | Status: DC
  Administered 2020-03-03 – 2020-03-05 (×3): 75 mg via ORAL
  Filled 2020-03-03 (×3): qty 1

## 2020-03-03 MED ORDER — ENOXAPARIN SODIUM 40 MG/0.4ML ~~LOC~~ SOLN: 40.0000 mg | Freq: Every day | SUBCUTANEOUS | Status: DC

## 2020-03-03 MED ORDER — ASPIRIN EC 81 MG PO TBEC
81.0000 mg | DELAYED_RELEASE_TABLET | Freq: Every day | ORAL | Status: DC
  Administered 2020-03-03 – 2020-03-05 (×3): 81 mg via ORAL
  Filled 2020-03-03 (×3): qty 1

## 2020-03-03 MED ORDER — ATORVASTATIN CALCIUM 40 MG PO TABS
40.0000 mg | ORAL_TABLET | Freq: Every day | ORAL | Status: DC
  Administered 2020-03-03 – 2020-03-05 (×3): 40 mg via ORAL
  Filled 2020-03-03 (×3): qty 1

## 2020-03-03 MED ORDER — AMLODIPINE BESYLATE 10 MG PO TABS
10.0000 mg | ORAL_TABLET | Freq: Every day | ORAL | Status: DC
  Administered 2020-03-03 – 2020-03-05 (×3): 10 mg via ORAL
  Filled 2020-03-03 (×3): qty 1

## 2020-03-03 NOTE — Progress Notes (Addendum)
STROKE TEAM PROGRESS NOTE   INTERVAL HISTORY No family at bedside.  Patient lying in bed, only complains of left hand tingly feeling.  No weakness.  Neuro stable.  MRI showed right MCA scattered small infarcts.  TEE pending.  OBJECTIVE Vitals:   03/03/20 0500 03/03/20 0600 03/03/20 0700 03/03/20 0800  BP: (!) 151/90 (!) 163/90 (!) 167/136 133/83  Pulse: 67 (!) 54 60 (!) 55  Resp: 17 20 (!) 23 15  Temp:    98.3 F (36.8 C)  TempSrc:    Oral  SpO2: 98% 99% 98% 99%  Weight:      Height:       CBC:  Recent Labs  Lab 03/02/20 0513 03/02/20 0518  WBC 7.0  --   NEUTROABS 4.0  --   HGB 15.5 16.3  HCT 47.7 48.0  MCV 85.5  --   PLT 225  --    Basic Metabolic Panel:  Recent Labs  Lab 03/02/20 0513 03/02/20 0518  NA 142 144  K 3.3* 3.2*  CL 108 105  CO2 25  --   GLUCOSE 117* 111*  BUN 9 10  CREATININE 1.12 1.10  CALCIUM 8.8*  --    Lipid Panel: No results found for: CHOL, TRIG, HDL, CHOLHDL, VLDL, LDLCALC HgbA1c: No results found for: HGBA1C Urine Drug Screen:     Component Value Date/Time   LABOPIA NONE DETECTED 03/02/2020 1530   COCAINSCRNUR NONE DETECTED 03/02/2020 1530   LABBENZ NONE DETECTED 03/02/2020 1530   AMPHETMU NONE DETECTED 03/02/2020 1530   THCU POSITIVE (A) 03/02/2020 1530   LABBARB NONE DETECTED 03/02/2020 1530    Alcohol Level     Component Value Date/Time   ETH <10 03/02/2020 0513    IMAGING CT HEAD CODE STROKE WO CONTRAST 03/02/2020 IMPRESSION:  1. Normal head CT.  2. ASPECTS is 10.   CT ANGIO HEAD CODE STROKE CT ANGIO NECK CODE STROKE CT CEREBRAL PERFUSION W CONTRAST 03/02/2020 IMPRESSION:  1. Emergent large vessel occlusion of the distal right M1 segment. Good collateral flow in the right MCA territory.  2. Ischemic penumbra measuring 34 mL in the right MCA territory without complete infarction.   CT ANGIO HEAD CODE STROKE 03/02/2020 IMPRESSION:  Recanalization of distal right M1 MCA with mild residual narrowing. Improved flow  within the right MCA territory.   MRI Brain WO Contrast  03/03/2020 IMPRESSION:  Small acute infarcts scattered about the right MCA territory.  Transthoracic Echocardiogram  03/02/2020 1. Left ventricular ejection fraction, by estimation, is 60 to 65%. The left ventricle has normal function. The left ventricle has no regional wall motion abnormalities. Left ventricular diastolic parameters were normal.  2. Right ventricular systolic function is normal. The right ventricular size is normal.  3. The mitral valve is normal in structure. No evidence of mitral valve regurgitation. No evidence of mitral stenosis.  4. The aortic valve is normal in structure. Aortic valve regurgitation is not visualized. No aortic stenosis is present.  5. The inferior vena cava is normal in size with greater than 50% respiratory variability, suggesting right atrial pressure of 3 mmHg.   LE Dopplers 03/02/2020 No DVT   PHYSICAL EXAM  Temp:  [98 F (36.7 C)-98.9 F (37.2 C)] 98.3 F (36.8 C) (11/01 0800) Pulse Rate:  [53-79] 55 (11/01 0800) Resp:  [10-27] 15 (11/01 0800) BP: (98-167)/(55-140) 133/83 (11/01 0800) SpO2:  [98 %-100 %] 99 % (11/01 0800)  General - Well nourished, well developed, in no apparent distress.  Ophthalmologic -  fundi not visualized due to noncooperation.  Cardiovascular - Regular rhythm and rate.  Mental Status -  Level of arousal and orientation to time, place, and person were intact. Language including expression, naming, repetition, comprehension was assessed and found intact. Fund of Knowledge was assessed and was intact.  Cranial Nerves II - XII - II - Visual field intact OU. III, IV, VI - Extraocular movements intact. V - Facial sensation intact bilaterally. VII - Facial movement intact bilaterally. VIII - Hearing & vestibular intact bilaterally. X - Palate elevates symmetrically. XI - Chin turning & shoulder shrug intact bilaterally. XII - Tongue protrusion  intact.  Motor Strength - The patient's strength was normal in all extremities and pronator drift was absent.  Bulk was normal and fasciculations were absent.   Motor Tone - Muscle tone was assessed at the neck and appendages and was normal.  Reflexes - The patient's reflexes were symmetrical in all extremities and he had no pathological reflexes.  Sensory - Light touch, temperature/pinprick were assessed and were symmetrical.  Subjective left hand tingling.  Coordination - The patient had normal movements in the hands with no ataxia or dysmetria.  Tremor was absent.  Gait and Station - deferred.   ASSESSMENT/PLAN Mr. Curtis Beltran is a 34 y.o. male with history of tobacco use who went to bed at 0100 and woke with left sided weakness. The patient received IV t-PA Sunday 03/02/20 at 5:45 AM. Pt consented to thrombectomy but Rt M1 spontaneously recanalized post tPA.   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  CT Head - Normal head CT. ASPECTS is 10.   MRI head - small R MCA territory scattered infarcts  CTA H&N - Emergent large vessel occlusion of the distal right M1 segment. Good collateral flow in the right MCA territory.  CT Perfusion - Ischemic penumbra measuring 34 mL in the right MCA territory without complete infarction.   CTA Head repeat - Recanalization of distal right M1 MCA with mild residual narrowing. Improved flow within the right MCA territory.  LE Venous Dopplers - no DVT  2D Echo - EF 60-65%. No source of embolus   TEE scheduled for Wed 11/3 at 1330 to further evaluate cardiac source  [employer] Virus 2 - negative  LDL - 142  HgbA1c - 5.9  UDS - THC+  Hypercoagulable work up pending  VTE prophylaxis - Lovenox 40 mg sq daily   No antithrombotic prior to admission, now on ASA and plavix DAPT for 3 weeks and then ASA alone.  Therapy recommendations:  outpt PT  Disposition:   Pending  Hypertension  Home BP meds: none   Stable 140-160s  On amlodipine 10 . Long-term BP goal normotensive  Hyperlipidemia  Home meds none  LDL 142, goal less than 70  On Lipitor 40  Continue statin on discharge  Tobacco abuse  Current smoker  Smoking cessation counseling provided  Pt is willing to quit  Other Stroke Risk Factors  UDS - THC+, counsel to stop use d/t stroke risk  Other Active Problems  Code status - Full code  Hypokalemia - 3.3 - 3.2 ->3.7   Bradycardia 50s  Hospital day # 1  This patient is critically ill due to right MCA stroke status post TPA and at significant risk of neurological worsening, death form recurrent stroke, hemorrhagic conversion, hemorrhage from TPA. This patient's care requires constant monitoring of vital signs, hemodynamics, respiratory  and cardiac monitoring, review of multiple databases, neurological assessment, discussion with family, other specialists and medical decision making of high complexity. I spent 30 minutes of neurocritical care time in the care of this patient.   Marvel Plan, MD PhD Stroke Neurology 03/03/2020 9:41 AM  To contact Stroke Continuity provider, please refer to [email or web address]. After hours, contact General Neurology

## 2020-03-03 NOTE — Research (Signed)
Patient meets criteria for OPTIMISTmain trial. I reviewed Patient Information Sheet and made patient aware of data collection, discharge questions and 90 day follow-up. Patient agreed to be a part of the study and reported he had no questions at this time.

## 2020-03-03 NOTE — Evaluation (Addendum)
Occupational Therapy Evaluation Patient Details Name: Curtis Beltran MRN: [id number] DOB: 06-17-1985 Today's Date: 03/03/2020    History of Present Illness The pt is a 34 yo male presenting with L-sided weakness. Initial imaging revealed ischemic penumbra in R MCA territory, and successful revascularization in repeat scan after tpa (given 0543 10/31). The pt is a current smoker, but has no other significant PMH.    Clinical Impression   Pt presents to OT with the above listed diagnosis.  He presents with mildly decreased sensation Lt LE and decreased activity tolerance compared to his normal.  He is able to perform ADLs mod I.  Cognition and vision appear at baseline with no deficits noted.  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  Discussed stroke, risk factors, BEFAST, and post stroke fatigue.  No further OT recommended.  OT will sign off.     Follow Up Recommendations  No OT follow up    Equipment Recommendations  None recommended by OT    Recommendations for Other Services       Precautions / Restrictions Precautions Precautions: Fall      Mobility Bed Mobility Overal bed mobility: Independent                  Transfers Overall transfer level: Needs assistance Equipment used: None Transfers: Sit to/from Stand;Stand Pivot Transfers Sit to Stand: Supervision Stand pivot transfers: Supervision            Balance Overall balance assessment: Independent   Sitting balance-Leahy Scale: Normal       Standing balance-Leahy Scale: Normal                       ADL either performed or assessed with clinical judgement   ADL Overall ADL's : Modified independent                                             Vision Baseline Vision/History:  (wears contacts ) Patient Visual Report: No change from baseline Vision Assessment?: Yes Eye Alignment: Within Functional Limits Ocular Range of Motion: Within Functional  Limits Alignment/Gaze Preference: Within Defined Limits Tracking/Visual Pursuits: Able to track stimulus in all quads without difficulty Visual Fields: No apparent deficits     Development worker, international aid Tested?: Yes   Praxis Praxis Praxis tested?: Within functional limits    Pertinent Vitals/Pain Pain Assessment: No/denies pain     Hand Dominance Right   Extremity/Trunk Assessment Upper Extremity Assessment Upper Extremity Assessment: Defer to OT evaluation   Lower Extremity Assessment Lower Extremity Assessment: LLE deficits/detail LLE Deficits / Details: MMT 5/5 and ROM WFL LLE Sensation: decreased light touch LLE Coordination: WNL   Cervical / Trunk Assessment Cervical / Trunk Assessment: Normal   Communication Communication Communication: No difficulties   Cognition Arousal/Alertness: Awake/alert Behavior During Therapy: WFL for tasks assessed/performed Overall Cognitive Status: Within Functional Limits for tasks assessed                                 General Comments: pt able to perform path finding without difficulty    General Comments  BP 164/114.  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         OT Problem List:        OT Treatment/Interventions:      OT Goals(Current goals can be found in the care plan section) Acute Rehab OT Goals Patient Stated Goal: return to PLOF OT Goal Formulation: All assessment and  education complete, DC therapy  OT Frequency:     Barriers to D/C:            Co-evaluation              AM-PAC OT "6 Clicks" Daily Activity     Outcome Measure Help from another person eating meals?: None Help from another person taking care of personal grooming?: None Help from another person toileting, which includes using toliet, bedpan, or urinal?: None Help from another person bathing (including washing, rinsing, drying)?: None Help from another person to put on and taking off regular upper body clothing?: None Help from another person to put on and taking off regular lower body clothing?: None 6 Click Score: 24   End of Session Equipment Utilized During Treatment: Gait belt Nurse Communication: Mobility status  Activity Tolerance: Patient tolerated treatment well Patient left: in chair;with call bell/phone within reach  OT Visit Diagnosis: Unsteadiness on feet (R26.81)                Time: 2956-2130 OT Time Calculation (min): 15 min Charges:  OT General Charges $OT Visit: 1 Visit OT Evaluation $OT Eval Low Complexity: 1 Low  Eber Jones., OTR/L Acute Rehabilitation Services Pager [phone] Office [phone]   Jeani Hawking M 03/03/2020, 3:36 PM

## 2020-03-03 NOTE — Evaluation (Signed)
Speech Language Pathology Evaluation Patient Details Name: Ashtan Girtman MRN: [id number] DOB: 02-Feb-1986 Today's Date: 03/03/2020 Time: 6144-3154 SLP Time Calculation (min) (ACUTE ONLY): 20 min  Problem List:  Patient Active Problem List   Diagnosis Date Noted   Stroke (cerebrum) (HCC) 03/02/2020   Past Medical History: History reviewed. No pertinent past medical history. Past Surgical History: No past surgical history on file. HPI:  34 y.o. male with history of tobacco use who went to bed at 0100 and woke with left sided weakness. The patient received IV t-PA Sunday 03/02/20 at 5:45 AM. Pt consented to thrombectomy but Rt M1 recanalized.    Assessment / Plan / Recommendation Clinical Impression  Pt participated in cognitive-linguistic exam. Presents with normal expressive and receptive language, normal attention, memory, executive functioning.  Speech is clear and fluent.  Pt does express high stress levels - provided listening and encouragement; we talked about some ways to manage his stress. No SLP f/u is needed.  D/W RN.       SLP Assessment  SLP Recommendation/Assessment: Patient does not need any further Speech Lanaguage Pathology Services SLP Visit Diagnosis: Cognitive communication deficit (R41.841)    Follow Up Recommendations    none   Frequency and Duration     n/a      SLP Evaluation Cognition  Overall Cognitive Status: Within Functional Limits for tasks assessed Orientation Level: Oriented X4 Attention: Alternating Alternating Attention: Appears intact Memory: Appears intact Awareness: Appears intact Executive Function: Reasoning Reasoning: Appears intact       Comprehension  Auditory Comprehension Overall Auditory Comprehension: Appears within functional limits for tasks assessed Visual Recognition/Discrimination Discrimination: Within Function Limits Reading Comprehension Reading Status: Within funtional limits    Expression Expression Primary  Mode of Expression: Verbal Verbal Expression Overall Verbal Expression: Appears within functional limits for tasks assessed Written Expression Dominant Hand: Right   Oral / Motor  Oral Motor/Sensory Function Overall Oral Motor/Sensory Function: Within functional limits Motor Speech Overall Motor Speech: Appears within functional limits for tasks assessed   GO                    Blenda Mounts Laurice 03/03/2020, 11:02 AM Marchelle Folks L. Samson Frederic, MA CCC/SLP Acute Rehabilitation Services Office number [phone] Pager [phone]

## 2020-03-03 NOTE — Evaluation (Signed)
Physical Therapy Evaluation Patient Details Name: Curtis Beltran MRN: [id number] DOB: 06/25/1985 Today's Date: 03/03/2020   History of Present Illness  The pt is a 34 yo male presenting with L-sided weakness. Initial imaging revealed ischemic penumbra in R MCA territory, and successful revascularization in repeat scan after tpa (given 0543 10/31). The pt is a current smoker, but has no other significant PMH.   Clinical Impression  Pt admitted with above diagnosis.  Pt's symptoms have nearly resolved.  He demonstrates excellent strength and balance.  Did have some fatigue and L LE decreased sensation but demonstrate safe gait and transfers with no evidence of fall risk or decreased sensation.  Pt was educated on stroke recovery and his good progress.  Discussed could do outpatient PT for very high level balance activities if he felt he was not able to return to normal activity when cleared by MD.     Follow Up Recommendations Other (comment) (potential outpt PT if not returning to normal activities)    Equipment Recommendations  None recommended by PT    Recommendations for Other Services       Precautions / Restrictions Precautions Precautions: Fall      Mobility  Bed Mobility Overal bed mobility: Independent                  Transfers Overall transfer level: Needs assistance Equipment used: None Transfers: Sit to/from [employer] Sit to Stand: Supervision Stand pivot transfers: Supervision          Ambulation/Gait Ambulation/Gait assistance: Supervision Gait Distance (Feet): 400 Feet Assistive device: None Gait Pattern/deviations: WFL(Within Functional Limits) Gait velocity: normal      Stairs Stairs: Yes Stairs assistance: Supervision Stair Management: No rails;Alternating pattern Number of Stairs: 12    Wheelchair Mobility    Modified Rankin (Stroke Patients Only) Modified Rankin (Stroke Patients Only) Pre-Morbid Rankin Score: No  symptoms Modified Rankin: No significant disability     Balance Overall balance assessment: Independent   Sitting balance-Leahy Scale: Normal       Standing balance-Leahy Scale: Normal               High level balance activites: Side stepping;Backward walking;Direction changes;Turns;Sudden stops;Head turns   Standardized Balance Assessment Standardized Balance Assessment : Dynamic Gait Index   Dynamic Gait Index Level Surface: Normal Change in Gait Speed: Normal Gait with Horizontal Head Turns: Normal Gait with Vertical Head Turns: Normal Gait and Pivot Turn: Normal Step Over Obstacle: Normal Step Around Obstacles: Normal Steps: Normal Total Score: 24       Pertinent Vitals/Pain Pain Assessment: No/denies pain    Home Living Family/patient expects to be discharged to:: Private residence Living Arrangements: Spouse/significant other   Type of Home: Apartment Home Access: Level entry     Home Layout: One level        Prior Function Level of Independence: Independent         Comments: 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, 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, newspaper   Dominant Hand: Right    Extremity/Trunk Assessment   Upper Extremity Assessment Upper Extremity Assessment: Defer to OT evaluation    Lower Extremity Assessment Lower Extremity Assessment: LLE deficits/detail LLE Deficits / Details: MMT 5/5 and ROM WFL LLE Sensation: decreased light touch LLE Coordination: WNL    Cervical / Trunk Assessment Cervical / Trunk Assessment: Normal  Communication   Communication: No difficulties  Cognition Arousal/Alertness: Awake/alert Behavior During Therapy:  WFL for tasks assessed/performed Overall Cognitive Status: Within Functional Limits for tasks assessed    Demonstrated good memory with task, followed multistep commands, no cognitive deficits during session.                                     General  Comments  Pt educated on stroke recovery, risk factors, and signs/symptoms of stroke.     Exercises     Assessment/Plan    PT Assessment All further PT needs can be met in the next venue of care  PT Problem List         PT Treatment Interventions      PT Goals (Current goals can be found in the Care Plan section)  Acute Rehab PT Goals Patient Stated Goal: return to PLOF PT Goal Formulation: All assessment and education complete, DC therapy    Frequency     Barriers to discharge        Co-evaluation               AM-PAC PT "6 Clicks" Mobility  Outcome Measure Help needed turning from your back to your side while in a flat bed without using bedrails?: None Help needed moving from lying on your back to sitting on the side of a flat bed without using bedrails?: None Help needed moving to and from a bed to a chair (including a wheelchair)?: None Help needed standing up from a chair using your arms (e.g., wheelchair or bedside chair)?: None Help needed to walk in hospital room?: None Help needed climbing 3-5 steps with a railing? : None 6 Click Score: 24    End of Session Equipment Utilized During Treatment: Gait belt Activity Tolerance: Patient tolerated treatment well Patient left: in chair;with call bell/phone within reach Nurse Communication: Mobility status      Time: 3507-5732 PT Time Calculation (min) (ACUTE ONLY): 30 min   Charges:   PT Evaluation $PT Eval Moderate Complexity: 1 Mod          Curtis Beltran, PT Acute Rehab Services Pager [phone] [facility] [phone]    Karlton Lemon 03/03/2020, 1:41 PM

## 2020-03-04 DIAGNOSIS — E785 Hyperlipidemia, unspecified: Secondary | ICD-10-CM | POA: Diagnosis present

## 2020-03-04 DIAGNOSIS — R001 Bradycardia, unspecified: Secondary | ICD-10-CM | POA: Diagnosis present

## 2020-03-04 DIAGNOSIS — F1290 Cannabis use, unspecified, uncomplicated: Secondary | ICD-10-CM | POA: Diagnosis present

## 2020-03-04 DIAGNOSIS — Z720 Tobacco use: Secondary | ICD-10-CM | POA: Diagnosis present

## 2020-03-04 DIAGNOSIS — I10 Essential (primary) hypertension: Secondary | ICD-10-CM | POA: Diagnosis present

## 2020-03-04 DIAGNOSIS — I63411 Cerebral infarction due to embolism of right middle cerebral artery: Principal | ICD-10-CM

## 2020-03-04 DIAGNOSIS — E876 Hypokalemia: Secondary | ICD-10-CM | POA: Diagnosis present

## 2020-03-04 LAB — CBC
HCT: 46.8 % (ref 39.0–52.0)
Hemoglobin: 15.6 g/dL (ref 13.0–17.0)
MCH: 28.1 pg (ref 26.0–34.0)
MCHC: 33.3 g/dL (ref 30.0–36.0)
MCV: 84.2 fL (ref 80.0–100.0)
Platelets: 220 10*3/uL (ref 150–400)
RBC: 5.56 MIL/uL (ref 4.22–5.81)
RDW: 14.7 % (ref 11.5–15.5)
WBC: 5.9 10*3/uL (ref 4.0–10.5)
nRBC: 0 % (ref 0.0–0.2)

## 2020-03-04 LAB — BASIC METABOLIC PANEL
Anion gap: 10 (ref 5–15)
BUN: 8 mg/dL (ref 6–20)
CO2: 24 mmol/L (ref 22–32)
Calcium: 8.7 mg/dL — ABNORMAL LOW (ref 8.9–10.3)
Chloride: 108 mmol/L (ref 98–111)
Creatinine, Ser: 1.03 mg/dL (ref 0.61–1.24)
GFR, Estimated: >60 mL/min (ref >60)
Glucose, Bld: 106 mg/dL — ABNORMAL HIGH (ref 70–99)
Potassium: 3.5 mmol/L (ref 3.5–5.1)
Sodium: 142 mmol/L (ref 135–145)

## 2020-03-04 LAB — CARDIOLIPIN ANTIBODIES, IGG, IGM, IGA
Anticardiolipin IgA: <9 APL U/mL (ref 0–11)
Anticardiolipin IgG: <9 GPL U/mL (ref 0–14)
Anticardiolipin IgM: 11 MPL U/mL (ref 0–12)

## 2020-03-04 LAB — GLUCOSE, CAPILLARY: Glucose-Capillary: 133 mg/dL — ABNORMAL HIGH (ref 70–99)

## 2020-03-04 LAB — ANTINUCLEAR ANTIBODIES, IFA: ANA Ab, IFA: NEGATIVE

## 2020-03-04 LAB — BETA-2-GLYCOPROTEIN I ABS, IGG/M/A
Beta-2 Glyco I IgG: <9 GPI IgG units (ref 0–20)
Beta-2-Glycoprotein I IgA: <9 GPI IgA units (ref 0–25)
Beta-2-Glycoprotein I IgM: <9 GPI IgM units (ref 0–32)

## 2020-03-04 LAB — SICKLE CELL SCREEN: Sickle Cell Screen: NEGATIVE

## 2020-03-04 LAB — HOMOCYSTEINE: Homocysteine: 11.6 umol/L (ref 0.0–14.5)

## 2020-03-04 MED ORDER — SODIUM CHLORIDE 0.9 % IV SOLN: INTRAVENOUS | Status: DC

## 2020-03-04 NOTE — TOC Initial Note (Signed)
Transition of Care [facility]) - Initial/Assessment Note    Patient Details  Name: Curtis Beltran MRN: [id number] Date of Birth: [birth year]  [facility]) CM/SW Contact:    Curtis Friar, RN Phone Number: 03/04/2020, 1:39 PM  Clinical Narrative:                 CM met with the patient about no PCP and no insurance. Pt states his child's mother is trying to add him to her insurance. In the meantime he is interested in one of the [facility]. Appt on AVS and information about [facility] pharmacy on AVS. Pt denies issues with transportation and wasn't on any medications prior to admission.  TOC following for further d/c needs.   Expected Discharge Plan: OP Rehab Barriers to Discharge: Inadequate or no insurance, Continued Medical Work up   Patient Goals and CMS Choice     Choice offered to / list presented to : Patient  Expected Discharge Plan and Services Expected Discharge Plan: OP Rehab   Discharge Planning Services: CM Consult   Living arrangements for the past 2 months: Apartment                                      Prior Living Arrangements/Services Living arrangements for the past 2 months: Apartment Lives with:: Self Patient language and need for interpreter reviewed:: Yes Do you feel safe going back to the place where you live?: Yes      Need for Family Participation in Patient Care: No (Comment) Care giver support system in place?: No (comment)   Criminal Activity/Legal Involvement Pertinent to Current Situation/Hospitalization: No - Comment as needed  Activities of Daily Living Home Assistive Devices/Equipment: None ADL Screening (condition at time of admission) Patient's cognitive ability adequate to safely complete daily activities?: Yes Is the patient deaf or have difficulty hearing?: No Does the patient have difficulty seeing, even when wearing glasses/contacts?: No Does the patient have difficulty concentrating, remembering, or making  decisions?: No Patient able to express need for assistance with ADLs?: Yes Does the patient have difficulty dressing or bathing?: No Independently performs ADLs?: Yes (appropriate for developmental age) Does the patient have difficulty walking or climbing stairs?: No Weakness of Legs: None Weakness of Arms/Hands: None  Permission Sought/Granted                  Emotional Assessment Appearance:: Appears stated age Attitude/Demeanor/Rapport: Engaged Affect (typically observed): Accepting Orientation: : Oriented to Self, Oriented to  Time, Oriented to Situation, Oriented to Place   Psych Involvement: No (comment)  Admission diagnosis:  Hypokalemia [E87.6] Stroke ([place]) [I63.9] Stroke (cerebrum) (HCC) [I63.9] Acute ischemic stroke [facility]) [I63.9] Cerebrovascular accident (CVA), unspecified mechanism ([place]) [I63.9] Patient Active Problem List   Diagnosis Date Noted  . Stroke (cerebrum) ([place]) 03/02/2020   PCP:  Patient, No Pcp Per Pharmacy:   [id number] [id number] [id number]: [phone]: [phone]    Social Determinants of Health (SDOH) Interventions    Readmission Risk Interventions No flowsheet data found.

## 2020-03-04 NOTE — Plan of Care (Signed)
  Problem: Education: Goal: Knowledge of disease or condition will improve Outcome: Progressing Goal: Knowledge of secondary prevention will improve Outcome: Progressing Goal: Knowledge of patient specific risk factors addressed and post discharge goals established will improve Outcome: Progressing Goal: Individualized Educational Video(s) Outcome: Progressing   Problem: Coping: Goal: Will verbalize positive feelings about self Outcome: Progressing Goal: Will identify appropriate support needs Outcome: Progressing   Problem: Ischemic Stroke/TIA Tissue Perfusion: Goal: Complications of ischemic stroke/TIA will be minimized Outcome: Progressing   

## 2020-03-04 NOTE — Progress Notes (Signed)
    [facility] has been requested to perform a transesophageal echocardiogram on 03/05/20 for Stroke with Dr. Anne Fu.  After careful review of history and examination, the risks and benefits of transesophageal echocardiogram have been explained including risks of esophageal damage, perforation (1:10,000 risk), bleeding, pharyngeal hematoma as well as other potential complications associated with conscious sedation including aspiration, arrhythmia, respiratory failure and death. Alternatives to treatment were discussed, questions were answered. Patient is willing to proceed. Labs and vital signs stable.   Laverda Page, NP-C 03/04/2020 1:23 PM

## 2020-03-04 NOTE — Progress Notes (Signed)
STROKE TEAM PROGRESS NOTE   INTERVAL HISTORY Patient walking in the room without difficulty.  No complaints.  Pending TEE tomorrow.  OBJECTIVE Vitals:   03/04/20 0447 03/04/20 0836 03/04/20 1246 03/04/20 1614  BP: (!) 154/104 (!) 156/102 (!) 148/89 (!) 145/102  Pulse: (!) 56 (!) 55 62 71  Resp: 18 18  16   Temp: (!) 97.4 F (36.3 C) 98.3 F (36.8 C) 98 F (36.7 C) 98.3 F (36.8 C)  TempSrc: Oral Oral Oral Oral  SpO2: 100% 99% 100% 100%  Weight:      Height:       CBC:  Recent Labs  Lab 03/02/20 0513 03/02/20 0518 03/03/20 0858 03/04/20 0504  WBC 7.0   < > 6.6 5.9  NEUTROABS 4.0  --   --   --   HGB 15.5   < > 14.8 15.6  HCT 47.7   < > 45.4 46.8  MCV 85.5   < > 84.1 84.2  PLT 225   < > 221 220   < > = values in this interval not displayed.   Basic Metabolic Panel:  Recent Labs  Lab 03/03/20 0858 03/04/20 0504  NA 140 142  K 3.7 3.5  CL 109 108  CO2 23 24  GLUCOSE 95 106*  BUN 7 8  CREATININE 1.11 1.03  CALCIUM 8.5* 8.7*   Lipid Panel:     Component Value Date/Time   CHOL 205 (H) 03/03/2020 0858   TRIG 130 03/03/2020 0858   HDL 37 (L) 03/03/2020 0858   CHOLHDL 5.5 03/03/2020 0858   VLDL 26 03/03/2020 0858   LDLCALC 142 (H) 03/03/2020 0858   HgbA1c:  Lab Results  Component Value Date   HGBA1C 5.9 (H) 03/03/2020   Urine Drug Screen:     Component Value Date/Time   LABOPIA NONE DETECTED 03/02/2020 1530   COCAINSCRNUR NONE DETECTED 03/02/2020 1530   LABBENZ NONE DETECTED 03/02/2020 1530   AMPHETMU NONE DETECTED 03/02/2020 1530   THCU POSITIVE (A) 03/02/2020 1530   LABBARB NONE DETECTED 03/02/2020 1530    Alcohol Level     Component Value Date/Time   ETH <10 03/02/2020 0513    IMAGING CT HEAD CODE STROKE WO CONTRAST 03/02/2020 IMPRESSION:  1. Normal head CT.  2. ASPECTS is 10.   CT ANGIO HEAD CODE STROKE CT ANGIO NECK CODE STROKE CT CEREBRAL PERFUSION W CONTRAST 03/02/2020 IMPRESSION:  1. Emergent large vessel occlusion of the distal  right M1 segment. Good collateral flow in the right MCA territory.  2. Ischemic penumbra measuring 34 mL in the right MCA territory without complete infarction.   CT ANGIO HEAD CODE STROKE 03/02/2020 IMPRESSION:  Recanalization of distal right M1 MCA with mild residual narrowing. Improved flow within the right MCA territory.   MRI Brain WO Contrast  03/03/2020 IMPRESSION:  Small acute infarcts scattered about the right MCA territory.  Transthoracic Echocardiogram  03/02/2020 1. Left ventricular ejection fraction, by estimation, is 60 to 65%. The left ventricle has normal function. The left ventricle has no regional wall motion abnormalities. Left ventricular diastolic parameters were normal.  2. Right ventricular systolic function is normal. The right ventricular size is normal.  3. The mitral valve is normal in structure. No evidence of mitral valve regurgitation. No evidence of mitral stenosis.  4. The aortic valve is normal in structure. Aortic valve regurgitation is not visualized. No aortic stenosis is present.  5. The inferior vena cava is normal in size with greater than 50% respiratory variability,  suggesting right atrial pressure of 3 mmHg.   LE Dopplers 03/02/2020 No DVT   PHYSICAL EXAM   Temp:  [97.4 F (36.3 C)-98.8 F (37.1 C)] 98.3 F (36.8 C) (11/02 1614) Pulse Rate:  [55-71] 71 (11/02 1614) Resp:  [16-18] 16 (11/02 1614) BP: (141-166)/(89-105) 145/102 (11/02 1614) SpO2:  [99 %-100 %] 100 % (11/02 1614)  General - Well nourished, well developed, in no apparent distress.  Ophthalmologic - fundi not visualized due to noncooperation.  Cardiovascular - Regular rhythm and rate.  Mental Status -  Level of arousal and orientation to time, place, and person were intact. Language including expression, naming, repetition, comprehension was assessed and found intact. Fund of Knowledge was assessed and was intact.  Cranial Nerves II - XII - II - Visual field  intact OU. III, IV, VI - Extraocular movements intact. V - Facial sensation intact bilaterally. VII - Facial movement intact bilaterally. VIII - Hearing & vestibular intact bilaterally. X - Palate elevates symmetrically. XI - Chin turning & shoulder shrug intact bilaterally. XII - Tongue protrusion intact.  Motor Strength - The patient's strength was normal in all extremities and pronator drift was absent.  Bulk was normal and fasciculations were absent.   Motor Tone - Muscle tone was assessed at the neck and appendages and was normal.  Reflexes - The patient's reflexes were symmetrical in all extremities and he had no pathological reflexes.  Sensory - Light touch, temperature/pinprick were assessed and were symmetrical.    Coordination - The patient had normal movements in the hands with no ataxia or dysmetria.  Tremor was absent.  Gait and Station - deferred.   ASSESSMENT/PLAN Mr. Abu Heavin is a 34 y.o. male with history of tobacco use who went to bed at 0100 and woke with left sided weakness. The patient received IV t-PA Sunday 03/02/20 at 5:45 AM. Pt consented to thrombectomy but Rt M1 spontaneously recanalized post tPA.   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  CT Head - Normal head CT. ASPECTS is 10.   MRI head - small R MCA territory scattered infarcts  CTA H&N - Emergent large vessel occlusion of the distal right M1 segment. Good collateral flow in the right MCA territory.  CT Perfusion - Ischemic penumbra measuring 34 mL in the right MCA territory without complete infarction.   CTA Head repeat - Recanalization of distal right M1 MCA with mild residual narrowing. Improved flow within the right MCA territory.  LE Venous Dopplers - no DVT  2D Echo - EF 60-65%. No source of embolus   TEE scheduled for Wed 11/3 at 1330 to further evaluate cardiac source  [employer] Virus 2 -  negative  LDL - 142  HgbA1c - 5.9  UDS - THC+  ANA pending  Hypercoagulable work up negative  VTE prophylaxis - Lovenox 40 mg sq daily   No antithrombotic prior to admission, now on ASA and plavix DAPT for 3 weeks and then ASA alone.  Therapy recommendations:  Possible OP PT, no OT  Disposition:  Pending  Hypertension  Home BP meds: none   Stable 140-150s  On amlodipine 10 . Long-term BP goal normotensive  Hyperlipidemia  Home meds none  LDL 142, goal less than 70  On Lipitor 40  Continue statin on discharge  Tobacco abuse  Current smoker  Smoking cessation counseling provided  Pt is willing to quit  Other Stroke Risk  Factors  UDS - THC+, counsel to stop use d/t stroke risk  Other Active Problems  Code status - Full code  Hypokalemia - 3.3 - 3.2 ->3.7->3.5  Bradycardia 50s  Hospital day # 2  Marvel Plan, MD PhD Stroke Neurology 03/04/2020 4:25 PM  To contact Stroke Continuity provider, please refer to [email or web address]. After hours, contact General Neurology

## 2020-03-04 NOTE — H&P (View-Only) (Signed)
STROKE TEAM PROGRESS NOTE   INTERVAL HISTORY Patient walking in the room without difficulty.  No complaints.  Pending TEE tomorrow.  OBJECTIVE Vitals:   03/04/20 0447 03/04/20 0836 03/04/20 1246 03/04/20 1614  BP: (!) 154/104 (!) 156/102 (!) 148/89 (!) 145/102  Pulse: (!) 56 (!) 55 62 71  Resp: 18 18  16   Temp: (!) 97.4 F (36.3 C) 98.3 F (36.8 C) 98 F (36.7 C) 98.3 F (36.8 C)  TempSrc: Oral Oral Oral Oral  SpO2: 100% 99% 100% 100%  Weight:      Height:       CBC:  Recent Labs  Lab 03/02/20 0513 03/02/20 0518 03/03/20 0858 03/04/20 0504  WBC 7.0   < > 6.6 5.9  NEUTROABS 4.0  --   --   --   HGB 15.5   < > 14.8 15.6  HCT 47.7   < > 45.4 46.8  MCV 85.5   < > 84.1 84.2  PLT 225   < > 221 220   < > = values in this interval not displayed.   Basic Metabolic Panel:  Recent Labs  Lab 03/03/20 0858 03/04/20 0504  NA 140 142  K 3.7 3.5  CL 109 108  CO2 23 24  GLUCOSE 95 106*  BUN 7 8  CREATININE 1.11 1.03  CALCIUM 8.5* 8.7*   Lipid Panel:     Component Value Date/Time   CHOL 205 (H) 03/03/2020 0858   TRIG 130 03/03/2020 0858   HDL 37 (L) 03/03/2020 0858   CHOLHDL 5.5 03/03/2020 0858   VLDL 26 03/03/2020 0858   LDLCALC 142 (H) 03/03/2020 0858   HgbA1c:  Lab Results  Component Value Date   HGBA1C 5.9 (H) 03/03/2020   Urine Drug Screen:     Component Value Date/Time   LABOPIA NONE DETECTED 03/02/2020 1530   COCAINSCRNUR NONE DETECTED 03/02/2020 1530   LABBENZ NONE DETECTED 03/02/2020 1530   AMPHETMU NONE DETECTED 03/02/2020 1530   THCU POSITIVE (A) 03/02/2020 1530   LABBARB NONE DETECTED 03/02/2020 1530    Alcohol Level     Component Value Date/Time   ETH <10 03/02/2020 0513    IMAGING CT HEAD CODE STROKE WO CONTRAST 03/02/2020 IMPRESSION:  1. Normal head CT.  2. ASPECTS is 10.   CT ANGIO HEAD CODE STROKE CT ANGIO NECK CODE STROKE CT CEREBRAL PERFUSION W CONTRAST 03/02/2020 IMPRESSION:  1. Emergent large vessel occlusion of the distal  right M1 segment. Good collateral flow in the right MCA territory.  2. Ischemic penumbra measuring 34 mL in the right MCA territory without complete infarction.   CT ANGIO HEAD CODE STROKE 03/02/2020 IMPRESSION:  Recanalization of distal right M1 MCA with mild residual narrowing. Improved flow within the right MCA territory.   MRI Brain WO Contrast  03/03/2020 IMPRESSION:  Small acute infarcts scattered about the right MCA territory.  Transthoracic Echocardiogram  03/02/2020 1. Left ventricular ejection fraction, by estimation, is 60 to 65%. The left ventricle has normal function. The left ventricle has no regional wall motion abnormalities. Left ventricular diastolic parameters were normal.  2. Right ventricular systolic function is normal. The right ventricular size is normal.  3. The mitral valve is normal in structure. No evidence of mitral valve regurgitation. No evidence of mitral stenosis.  4. The aortic valve is normal in structure. Aortic valve regurgitation is not visualized. No aortic stenosis is present.  5. The inferior vena cava is normal in size with greater than 50% respiratory variability,  suggesting right atrial pressure of 3 mmHg.   LE Dopplers 03/02/2020 No DVT   PHYSICAL EXAM   Temp:  [97.4 F (36.3 C)-98.8 F (37.1 C)] 98.3 F (36.8 C) (11/02 1614) Pulse Rate:  [55-71] 71 (11/02 1614) Resp:  [16-18] 16 (11/02 1614) BP: (141-166)/(89-105) 145/102 (11/02 1614) SpO2:  [99 %-100 %] 100 % (11/02 1614)  General - Well nourished, well developed, in no apparent distress.  Ophthalmologic - fundi not visualized due to noncooperation.  Cardiovascular - Regular rhythm and rate.  Mental Status -  Level of arousal and orientation to time, place, and person were intact. Language including expression, naming, repetition, comprehension was assessed and found intact. Fund of Knowledge was assessed and was intact.  Cranial Nerves II - XII - II - Visual field  intact OU. III, IV, VI - Extraocular movements intact. V - Facial sensation intact bilaterally. VII - Facial movement intact bilaterally. VIII - Hearing & vestibular intact bilaterally. X - Palate elevates symmetrically. XI - Chin turning & shoulder shrug intact bilaterally. XII - Tongue protrusion intact.  Motor Strength - The patient's strength was normal in all extremities and pronator drift was absent.  Bulk was normal and fasciculations were absent.   Motor Tone - Muscle tone was assessed at the neck and appendages and was normal.  Reflexes - The patient's reflexes were symmetrical in all extremities and he had no pathological reflexes.  Sensory - Light touch, temperature/pinprick were assessed and were symmetrical.    Coordination - The patient had normal movements in the hands with no ataxia or dysmetria.  Tremor was absent.  Gait and Station - deferred.   ASSESSMENT/PLAN Mr. Curtis Beltran is a 34 y.o. male with history of tobacco use who went to bed at 0100 and woke with left sided weakness. The patient received IV t-PA Sunday 03/02/20 at 5:45 AM. Pt consented to thrombectomy but Rt M1 spontaneously recanalized post tPA.   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  CT Head - Normal head CT. ASPECTS is 10.   MRI head - small R MCA territory scattered infarcts  CTA H&N - Emergent large vessel occlusion of the distal right M1 segment. Good collateral flow in the right MCA territory.  CT Perfusion - Ischemic penumbra measuring 34 mL in the right MCA territory without complete infarction.   CTA Head repeat - Recanalization of distal right M1 MCA with mild residual narrowing. Improved flow within the right MCA territory.  LE Venous Dopplers - no DVT  2D Echo - EF 60-65%. No source of embolus   TEE scheduled for Wed 11/3 at 1330 to further evaluate cardiac source  [employer] Virus 2 -  negative  LDL - 142  HgbA1c - 5.9  UDS - THC+  ANA pending  Hypercoagulable work up negative  VTE prophylaxis - Lovenox 40 mg sq daily   No antithrombotic prior to admission, now on ASA and plavix DAPT for 3 weeks and then ASA alone.  Therapy recommendations:  Possible OP PT, no OT  Disposition:  Pending  Hypertension  Home BP meds: none   Stable 140-150s  On amlodipine 10 . Long-term BP goal normotensive  Hyperlipidemia  Home meds none  LDL 142, goal less than 70  On Lipitor 40  Continue statin on discharge  Tobacco abuse  Current smoker  Smoking cessation counseling provided  Pt is willing to quit  Other Stroke Risk  Factors  UDS - THC+, counsel to stop use d/t stroke risk  Other Active Problems  Code status - Full code  Hypokalemia - 3.3 - 3.2 ->3.7->3.5  Bradycardia 50s  Hospital day # 2  Marvel Plan, MD PhD Stroke Neurology 03/04/2020 4:25 PM  To contact Stroke Continuity provider, please refer to [email or web address]. After hours, contact General Neurology

## 2020-03-04 NOTE — TOC CAGE-AID Note (Signed)
Transition of Care [facility]) - CAGE-AID Screening   Patient Details  Name: Curtis Beltran MRN: [id number] Date of Birth: 1985-11-02  Transition of Care [facility]) CM/SW Contact:    Emeterio Reeve, [place] Phone Number: 03/04/2020, 4:19 PM   Clinical Narrative:  CSW met with pt at bedside. CSW introduced self and explained role at the hospital.  Pt denies alcohol use. Pt denies substance use. Pt did not want any resources at this time.    CAGE-AID Screening:    Have You Ever Felt You Ought to Cut Down on Your Drinking or Drug Use?: No Have People Annoyed You By Critizing Your Drinking Or Drug Use?: No   Have You Ever Had a Drink or Used Drugs First Thing In The Morning to Steady Your Nerves or to Get Rid of a Hangover?: No    Substance Abuse Education Offered: Yes     Blima Ledger, [place] Social Worker [phone]

## 2020-03-05 ENCOUNTER — Encounter (HOSPITAL_COMMUNITY): Payer: Self-pay | Admitting: Neurology

## 2020-03-05 ENCOUNTER — Encounter: Payer: Self-pay | Admitting: Neurology

## 2020-03-05 ENCOUNTER — Encounter (HOSPITAL_COMMUNITY)
Admission: EM | Disposition: A | Discharge status: Home / Self Care | Payer: Self-pay | Source: Home / Self Care | Attending: Neurology

## 2020-03-05 ENCOUNTER — Other Ambulatory Visit (HOSPITAL_COMMUNITY): Payer: Self-pay | Admitting: Nurse Practitioner

## 2020-03-05 ENCOUNTER — Inpatient Hospital Stay (HOSPITAL_COMMUNITY): Payer: Self-pay

## 2020-03-05 DIAGNOSIS — R001 Bradycardia, unspecified: Secondary | ICD-10-CM

## 2020-03-05 LAB — CBC
HCT: 45.1 % (ref 39.0–52.0)
Hemoglobin: 14.8 g/dL (ref 13.0–17.0)
MCH: 27.6 pg (ref 26.0–34.0)
MCHC: 32.8 g/dL (ref 30.0–36.0)
MCV: 84 fL (ref 80.0–100.0)
Platelets: 236 10*3/uL (ref 150–400)
RBC: 5.37 MIL/uL (ref 4.22–5.81)
RDW: 14.6 % (ref 11.5–15.5)
WBC: 7.5 10*3/uL (ref 4.0–10.5)
nRBC: 0 % (ref 0.0–0.2)

## 2020-03-05 LAB — BASIC METABOLIC PANEL
Anion gap: 12 (ref 5–15)
BUN: 15 mg/dL (ref 6–20)
CO2: 23 mmol/L (ref 22–32)
Calcium: 9 mg/dL (ref 8.9–10.3)
Chloride: 105 mmol/L (ref 98–111)
Creatinine, Ser: 1.01 mg/dL (ref 0.61–1.24)
GFR, Estimated: >60 mL/min (ref >60)
Glucose, Bld: 107 mg/dL — ABNORMAL HIGH (ref 70–99)
Potassium: 3.4 mmol/L — ABNORMAL LOW (ref 3.5–5.1)
Sodium: 140 mmol/L (ref 135–145)

## 2020-03-05 SURGERY — CANCELLED PROCEDURE

## 2020-03-05 MED ORDER — AMLODIPINE BESYLATE 10 MG PO TABS: 10.0000 mg | ORAL_TABLET | Freq: Every day | ORAL | 2 refills | Status: DC

## 2020-03-05 MED ORDER — ATORVASTATIN CALCIUM 40 MG PO TABS: 40.0000 mg | ORAL_TABLET | Freq: Every day | ORAL | 2 refills | Status: DC

## 2020-03-05 MED ORDER — CLOPIDOGREL BISULFATE 75 MG PO TABS: 75.0000 mg | ORAL_TABLET | Freq: Every day | ORAL | 0 refills | Status: DC

## 2020-03-05 MED ORDER — ASPIRIN 81 MG PO TBEC: 81.0000 mg | DELAYED_RELEASE_TABLET | Freq: Every day | ORAL | 11 refills | Status: DC

## 2020-03-05 MED FILL — ATORVASTATIN CALCIUM 40 MG: 40 | 30 days supply | Qty: 30 | Fill #0

## 2020-03-05 MED FILL — ASPIRIN LOW DOSE 81 MG TBEC: 81 | 30 days supply | Qty: 30 | Fill #0

## 2020-03-05 MED FILL — CLOPIDOGREL 75 MG TABLET: 75 | 21 days supply | Qty: 21 | Fill #0

## 2020-03-05 MED FILL — AMLODIPINE BESYLATE 10 MG T: 10 | 30 days supply | Qty: 30 | Fill #0

## 2020-03-05 NOTE — Progress Notes (Signed)
° °  Patient refuses TEE.  He and family are very uncomfortable with this procedure.   Explained to him how TEE can show left atrial appendage and other heart structures more clearly to see if there is any cardiac source for stroke. This is used in addition to TTE.   He does not wish to proceed.   Donato Schultz, MD

## 2020-03-05 NOTE — TOC Transition Note (Signed)
Transition of Care [facility]) - CM/SW Discharge Note   Patient Details  Name: Curtis Beltran MRN: [id number] Date of Birth: 04-01-1986  Transition of Care [facility]) CM/SW Contact:  Kermit Balo, RN Phone Number: 03/05/2020, 3:59 PM   Clinical Narrative:    Pt discharging home with outpatient therapy and information on the AVS. Pt did not have payment for d/c meds. CM has covered the cost. Meds delivered to the room per [facility]. Pt has transportation home.    Final next level of care: OP Rehab Barriers to Discharge: Inadequate or no insurance, Barriers Unresolved (comment)   Patient Goals and CMS Choice     Choice offered to / list presented to : Patient  Discharge Placement                       Discharge Plan and Services   Discharge Planning Services: CM Consult                                 Social Determinants of Health (SDOH) Interventions     Readmission Risk Interventions No flowsheet data found.

## 2020-03-05 NOTE — Interval H&P Note (Signed)
History and Physical Interval Note:  03/05/2020 1:17 PM  Curtis Beltran  has presented today for surgery, with the diagnosis of STROKE.  The various methods of treatment have been discussed with the patient and family. After consideration of risks, benefits and other options for treatment, the patient has consented to  Procedure(s): TRANSESOPHAGEAL ECHOCARDIOGRAM (TEE) (N/A) as a surgical intervention.  The patient's history has been reviewed, patient examined, no change in status, stable for surgery.  I have reviewed the patient's chart and labs.  Questions were answered to the patient's satisfaction.     [employer]

## 2020-03-05 NOTE — Discharge Summary (Addendum)
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 Start taking on: March 06, 2020   aspirin 81 MG EC tablet Take 1 tablet (81 mg total) by mouth daily. Swallow whole. Start taking on: March 06, 2020   atorvastatin 40 MG tablet Commonly known as: LIPITOR Take 1 tablet (40 mg total) by mouth daily. Start taking on: March 06, 2020   clopidogrel 75 MG tablet Commonly known as: PLAVIX Take 1 tablet (75 mg total) by mouth daily for 21 days. Start taking on: March 06, 2020       LABORATORY STUDIES CBC    Component Value Date/Time   WBC 7.5 03/05/2020 0238   RBC 5.37 03/05/2020 0238   HGB 14.8 03/05/2020 0238   HCT 45.1 03/05/2020 0238   PLT 236 03/05/2020 0238   MCV 84.0 03/05/2020 0238   MCH 27.6 03/05/2020 0238   MCHC 32.8 03/05/2020 0238   RDW 14.6 03/05/2020 0238   LYMPHSABS 2.4 03/02/2020 0513   MONOABS 0.4 03/02/2020 0513   EOSABS 0.2 03/02/2020 0513   BASOSABS 0.0 03/02/2020 0513   CMP    Component Value Date/Time   NA 140 03/05/2020 0238   K 3.4 (L) 03/05/2020 0238   CL 105 03/05/2020 0238   CO2 23 03/05/2020 0238   GLUCOSE 107 (H) 03/05/2020 0238   BUN 15 03/05/2020 0238   CREATININE 1.01 03/05/2020 0238   CALCIUM 9.0 03/05/2020 0238   PROT 5.6 (L) 03/02/2020 0513   ALBUMIN  3.5 03/02/2020 0513   AST 19 03/02/2020 0513   ALT 14 03/02/2020 0513   ALKPHOS 55 03/02/2020 0513   BILITOT 0.5 03/02/2020 0513   GFRNONAA >60 03/05/2020 0238   COAGS Lab Results  Component Value Date   INR 0.9 03/02/2020   Lipid Panel    Component Value Date/Time   CHOL 205 (H) 03/03/2020 0858   TRIG 130 03/03/2020 0858   HDL 37 (L) 03/03/2020 0858   CHOLHDL 5.5 03/03/2020 0858   VLDL 26 03/03/2020 0858   LDLCALC 142 (H) 03/03/2020 0858   HgbA1C  Lab Results  Component Value Date   HGBA1C 5.9 (H) 03/03/2020   Urinalysis    Component Value Date/Time   COLORURINE STRAW (A) 03/02/2020 1530   APPEARANCEUR CLEAR 03/02/2020 1530   LABSPEC 1.010 03/02/2020 1530   PHURINE 8.0 03/02/2020 1530   GLUCOSEU NEGATIVE 03/02/2020 1530   HGBUR NEGATIVE 03/02/2020 1530   BILIRUBINUR NEGATIVE 03/02/2020 1530   KETONESUR NEGATIVE 03/02/2020 1530   PROTEINUR NEGATIVE 03/02/2020 1530   NITRITE NEGATIVE 03/02/2020 1530   LEUKOCYTESUR NEGATIVE 03/02/2020 1530   Urine Drug Screen     Component Value Date/Time   LABOPIA NONE DETECTED 03/02/2020 1530   COCAINSCRNUR NONE DETECTED  03/02/2020 1530   LABBENZ NONE DETECTED 03/02/2020 1530   AMPHETMU NONE DETECTED 03/02/2020 1530   THCU POSITIVE (A) 03/02/2020 1530   LABBARB NONE DETECTED 03/02/2020 1530    Alcohol Level    Component Value Date/Time   ETH <10 03/02/2020 0513    SIGNIFICANT DIAGNOSTIC STUDIES CT HEAD CODE STROKE WO CONTRAST 03/02/2020 1. Normal head CT.  2. ASPECTS is 10.   CT ANGIO HEAD CODE STROKE CT ANGIO NECK CODE STROKE CT CEREBRAL PERFUSION W CONTRAST 03/02/2020 1. Emergent large vessel occlusion of the distal right M1 segment. Good collateral flow in the right MCA territory.  2. Ischemic penumbra measuring 34 mL in the right MCA territory without complete infarction.   CT ANGIO HEAD CODE STROKE 03/02/2020 Recanalization of distal right M1 MCA with mild residual narrowing. Improved flow within the  right MCA territory.   MRI Brain WO Contrast  03/03/2020 Small acute infarcts scattered about the right MCA territory.  Transthoracic Echocardiogram  03/02/2020 1. Left ventricular ejection fraction, by estimation, is 60 to 65%. The left ventricle has normal function. The left ventricle has no regional wall motion abnormalities. Left ventricular diastolic parameters were normal.  2. Right ventricular systolic function is normal. The right ventricular size is normal.  3. The mitral valve is normal in structure. No evidence of mitral valve regurgitation. No evidence of mitral stenosis.  4. The aortic valve is normal in structure. Aortic valve regurgitation is not visualized. No aortic stenosis is present.  5. The inferior vena cava is normal in size with greater than 50% respiratory variability, suggesting right atrial pressure of 3 mmHg.   LE Dopplers 03/02/2020 No DVT     HISTORY OF PRESENT ILLNESS Curtis Beltran is a 34 y.o. male right handed smoker no significant PMH went to bed at 0100 03/02/2020 and woke with left sided weakness. He has never had this in the past. IV tPA given. Delay for tPA and CTA due to difficulty vascular access. Found to have R M1 occlusion and planned for IR Thrombectomy, but recanalized post tPA and it was canceled. Premorbid Modified Rankin Scale: 0. NIHSS: 9   HOSPITAL COURSE Mr. Curtis Beltran is a 34 y.o. male with history of tobacco use who went to bed at 0100 and woke with left sided weakness. The patient received IV t-PA Sunday 03/02/20 at 5:45 AM. Pt consented to thrombectomy but Rt M1 spontaneously recanalized post tPA.   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  CT Head - Normal head CT. ASPECTS is 10.   MRI head - small R MCA territory scattered infarcts  CTA H&N - Emergent large vessel occlusion of the distal right M1 segment. Good collateral flow in the right  MCA territory.  CT Perfusion - Ischemic penumbra measuring 34 mL in the right MCA territory without complete infarction.   CTA Head repeat - Recanalization of distal right M1 MCA with mild residual narrowing. Improved flow within the right MCA territory.  LE Venous Dopplers - no DVT  2D Echo - EF 60-65%. No source of embolus   TEE scheduled for Wed 11/3 at 1330 to further evaluate cardiac source -> pt and family refused.   TCD bubble study discussed 11/3 but pt again refused  pt and family felt that the stroke etiology is due to his heavy smoking, unhealthy diet and tremendous stress, declined further testing   [employer] Virus 2 - negative  LDL - 142  HgbA1c - 5.9  UDS - THC+  ANA neg  Hypercoagulable work up negative  No antithrombotic prior to admission, now on ASA and plavix DAPT for 3 weeks and then ASA alone.  Therapy recommendations:  PT, no OT  Disposition:  Pending  Hypertension  Home BP meds: none   Stable 130-150  On amlodipine 10  Long-term BP goal normotensive  Close PCP follow up - scheduled at 11/10 with Gwinda Passe  Hyperlipidemia  Home meds none  LDL 142, goal less than 70  On Lipitor 40  Continue statin on discharge  Tobacco abuse  Current heavy smoker  Smoking cessation counseling provided  Pt is willing to quit  Other Stroke Risk Factors  UDS - THC+, counsel to stop use d/t stroke risk  Other Active Problems  Hypokalemia - 3.3 - 3.2 ->3.7->3.5->3.4  Bradycardia 50s during sleep last 24h  DISCHARGE EXAM Blood pressure (!) 150/93, pulse 63, temperature 97.8 F (36.6 C), temperature source Oral, resp. rate 16, height 5\' 9"  (1.753 m), weight 90.2 kg, SpO2 100 %. General - Well nourished, well developed, in no apparent distress.  Ophthalmologic - fundi not visualized due to noncooperation.  Cardiovascular - Regular rhythm and rate.  Mental Status -  Level of arousal and orientation to time, place, and  person were intact. Language including expression, naming, repetition, comprehension was assessed and found intact. Fund of Knowledge was assessed and was intact.  Cranial Nerves II - XII - II - Visual field intact OU. III, IV, VI - Extraocular movements intact. V - Facial sensation intact bilaterally. VII - Facial movement intact bilaterally. VIII - Hearing & vestibular intact bilaterally. X - Palate elevates symmetrically. XI - Chin turning & shoulder shrug intact bilaterally. XII - Tongue protrusion intact.  Motor Strength - The patient's strength was normal in all extremities and pronator drift was absent.  Bulk was normal and fasciculations were absent.   Motor Tone - Muscle tone was assessed at the neck and appendages and was normal.  Reflexes - The patient's reflexes were symmetrical in all extremities and he had no pathological reflexes.  Sensory - Light touch, temperature/pinprick were assessed and were symmetrical.    Coordination - The patient had normal movements in the hands with no ataxia or dysmetria.  Tremor was absent.  Gait and Station - deferred.  Discharge Diet   Heart healthy thin liquids  DISCHARGE PLAN  Disposition:  Return home  OP PT  aspirin 81 mg daily and clopidogrel 75 mg daily for secondary stroke prevention for 3 weeks then aspirin alone.  Ongoing stroke risk factor control by Primary Care Physician at time of discharge  Follow-up PCP in 2 weeks.  Follow-up in [facility] in 4 weeks, office to schedule an appointment.   Follow-up [facility] 03/12/2020 at 0930.   Follow-up [facility] for pharmacy needs   35 minutes were spent preparing discharge.  [facility], MD PhD Stroke Neurology 03/05/2020 6:09 PM

## 2020-03-05 NOTE — Progress Notes (Signed)
Upon preparing patient to discharge removed IV and noted that site looked swollen, was hard to the touch, and potentially infected. Dr. Roda Shutters paged, he assessed the site and confirmed patient was still ok to discharge, said to instruct patient on wound care for the site.  Discharge instructions reviewed with patient and confirmed through teach-back. Patient transported off unit via wheelchair and will be taken home by family member.

## 2020-03-06 LAB — LUPUS ANTICOAGULANT
DRVVT: 33.6 s (ref 0.0–47.0)
PTT Lupus Anticoagulant: 37.4 s (ref 0.0–51.9)
Thrombin Time: 15.9 s (ref 0.0–23.0)
dPT Confirm Ratio: 1.25 Ratio (ref 0.00–1.34)
dPT: 36.9 s (ref 0.0–47.6)

## 2020-03-12 ENCOUNTER — Inpatient Hospital Stay (INDEPENDENT_AMBULATORY_CARE_PROVIDER_SITE_OTHER): Payer: Self-pay | Admitting: Primary Care

## 2020-04-22 ENCOUNTER — Other Ambulatory Visit: Payer: Self-pay

## 2020-04-22 ENCOUNTER — Encounter: Payer: Self-pay | Admitting: Adult Health

## 2020-04-22 ENCOUNTER — Ambulatory Visit: Payer: Self-pay | Admitting: Adult Health

## 2020-04-22 VITALS — BP 120/80 | HR 62 | Ht 69.0 in | Wt 193.0 lb

## 2020-04-22 DIAGNOSIS — Z87891 Personal history of nicotine dependence: Secondary | ICD-10-CM

## 2020-04-22 DIAGNOSIS — R1013 Epigastric pain: Secondary | ICD-10-CM

## 2020-04-22 DIAGNOSIS — I10 Essential (primary) hypertension: Secondary | ICD-10-CM

## 2020-04-22 DIAGNOSIS — I63411 Cerebral infarction due to embolism of right middle cerebral artery: Secondary | ICD-10-CM

## 2020-04-22 DIAGNOSIS — E785 Hyperlipidemia, unspecified: Secondary | ICD-10-CM

## 2020-04-22 NOTE — Progress Notes (Signed)
I agree with the above plan 

## 2020-04-22 NOTE — Patient Instructions (Addendum)
Please call to establish care with PCP - Gwinda Passe, NP [facility] [phone]  Continue aspirin 81 mg daily (ensure these are enteric coated or says EC as this may help with acid reflux symptoms) and atorvastatin  for secondary stroke prevention  Okay to use tums or Pepto bismol for heart burn sensation   Continue to follow up with PCP regarding cholesterol and blood pressure management  Maintain strict control of hypertension with blood pressure goal below 130/90 and cholesterol with LDL cholesterol (bad cholesterol) goal below 70 mg/dL.     Followup in the future with me in 4 months or call earlier if needed    Thank you for coming to see [place] at [facility]. I hope we have been able to provide you high quality care today.  You may receive a patient satisfaction survey over the next few weeks. We would appreciate your feedback and comments so that we may continue to improve ourselves and the health of our patients.

## 2020-04-22 NOTE — Progress Notes (Signed)
[facility] [address] [place] [place] [address] [phone]       HOSPITAL FOLLOW UP NOTE  Mr. Curtis Beltran Date of Birth:  [birth year] Medical Record Number:  [id number]   Reason for Referral:  hospital stroke follow up    SUBJECTIVE:   CHIEF COMPLAINT:  Chief Complaint  Patient presents with  . Follow-up    Rm 9, alone, Pt c/o acid reflex, daily headaches, pt does not have PCP     HPI:   CurtisCurtis Hamptonis a 34 y.o.malewith history of tobacco use whowent to bed at 0100 and woke with left sided weakness on 03/02/2020.Personally reviewed hospitalization pertinent progress notes, lab work and imaging with summary provided. Evaluated by Dr. Roda Shutters with stroke work-up revealing right MCA scattered infarcts due to right M1 occlusion s/p tPA with recanalization, infarct embolic secondary to unknown source. Recommended to proceed with TEE and TCD bubble study but patient and family declined as patient and family felt stroke etiology due to heavy smoking, unhealthy diet and tremendous stress. LDL 142. A1c 5.9. HyperCard work-up negative. Initiated DAPT for 3 weeks and aspirin alone. No prior HTN history and placed on amlodipine 10 mg daily advised close PCP outpatient follow-up. Initiated atorvastatin 40 mg daily for HLD management. Current tobacco use with tobacco cessation provided. UDS positive for THC. Evaluated by therapies who recommended PT and discharged home in stable condition.  Stroke: Rt MCA scattered infarcts due to right M1 occlusion s/p tPA with recannulization - embolic - source unknown.  CT Head - Normal head CT. ASPECTS is 10.   MRI head -small R MCA territory scattered infarcts  CTA H&N - Emergent large vesselocclusion of the distal right M1 segment.Good collateral flow in the right MCA territory.  CT Perfusion - Ischemic penumbra measuring 34 mLin the right MCA territory without complete infarction.   CTA Head repeat -  Recanalization of distal right M1 MCA with mild residual narrowing.Improved flow within the right MCA territory.  LE Venous Dopplers -no DVT  2D Echo - EF 60-65%. No source of embolus  TEE scheduled for Wed 11/3 at 1330 to further evaluate cardiac source -> pt and family refused.   TCD bubble study discussed 11/3 but pt again refused  pt and family felt that the stroke etiology is due to his heavy smoking, unhealthy diet and tremendous stress, declined further testing   [employer] Virus 2 - negative  LDL -142  HgbA1c-5.9  UDS -THC+  ANA neg  Hypercoagulable work upnegative  No antithromboticprior to admission, now on ASA and plavix DAPT for 3 weeks and then ASA alone.  Therapy recommendations:PT, no OT  Disposition: home  Today, 04/22/2020, Curtis Beltran is being seen for hospital follow-up unaccompanied. He has recovered well from a stroke standpoint with residual occasional left hand tingling sensation and occasional mild occipital headaches. These have been slowly improving and currently experiencing 2 to 3/week with resolution after taking Tylenol.[name]  He also c/o acid reflux which she did not experience prior.  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  He is concerned of returning currently as he still fatigues quickly and experiencing headaches especially with increased activity or fatigue.  Denies new stroke/TIA symptoms. Completed 3 weeks DAPT and remains on aspirin alone without bleeding or bruising. Remains on atorvastatin 40 mg daily without myalgias. Blood pressure today 120/80. Monitors at home and typically 110-120/70s. Complete tobacco sensation since discharge.  No further concerns at this time.  ROS:   14 system review of systems performed and negative with exception of headaches, acid reflux, numbness  PMH: No past medical history on file.  PSH: No past surgical history on file.  Social History:  Social History    Socioeconomic History  . Marital status: Single    Spouse name: Not on file  . Number of children: Not on file  . Years of education: Not on file  . Highest education level: Not on file  Occupational History  . Not on file  Tobacco Use  . Smoking status: Light Tobacco Smoker    Types: Cigarettes, Cigars  . Smokeless tobacco: Never Used  Vaping Use  . Vaping Use: Some days  Substance and Sexual Activity  . Alcohol use: Never  . Drug use: Yes    Types: Marijuana  . Sexual activity: Not on file  Other Topics Concern  . Not on file  Social History Narrative  . Not on file   Social Determinants of Health   Financial Resource Strain: Not on file  Food Insecurity: Not on file  Transportation Needs: Not on file  Physical Activity: Not on file  Stress: Not on file  Social Connections: Not on file  Intimate Partner Violence: Not on file    Family History:  Family History  Problem Relation Age of Onset  . Healthy Mother   . Hypertension Maternal Aunt     Medications:   Current Outpatient Medications on File Prior to Visit  Medication Sig Dispense Refill  . amLODipine (NORVASC) 10 MG tablet Take 1 tablet (10 mg total) by mouth daily. 30 tablet 2  . aspirin EC 81 MG EC tablet Take 1 tablet (81 mg total) by mouth daily. Swallow whole. 30 tablet 11  . atorvastatin (LIPITOR) 40 MG tablet Take 1 tablet (40 mg total) by mouth daily. 30 tablet 2   No current facility-administered medications on file prior to visit.    Allergies:  No Known Allergies    OBJECTIVE:  Physical Exam  Vitals:   04/22/20 1020  BP: 120/80  Pulse: 62  Weight: 193 lb (87.5 kg)  Height: 5\' 9"  (1.753 m)   Body mass index is 28.5 kg/m. No exam data present  General: well developed, well nourished,  pleasant middle-aged African-American male, seated, in no evident distress Head: head normocephalic and atraumatic.   Neck: supple with no carotid or supraclavicular bruits Cardiovascular:  regular rate and rhythm, no murmurs Musculoskeletal: no deformity Skin:  no rash/petichiae Vascular:  Normal pulses all extremities   Neurologic Exam Mental Status: Awake and fully alert.   Fluent speech and language.  Oriented to place and time. Recent and remote memory intact. Attention span, concentration and fund of knowledge appropriate. Mood and affect appropriate.  Cranial Nerves: Fundoscopic exam reveals sharp disc margins. Pupils equal, briskly reactive to light. Extraocular movements full without nystagmus. Visual fields full to confrontation. Hearing intact. Facial sensation intact. Face, tongue, palate moves normally and symmetrically.  Motor: Normal bulk and tone. Normal strength in all tested extremity muscles except slightly decreased left hand dexterity Sensory.: intact to touch , pinprick , position and vibratory sensation.  Coordination: Rapid alternating movements normal in all extremities except slightly decreased left hand dexterity. Finger-to-nose and heel-to-shin performed accurately bilaterally. Gait and Station: Arises from chair without difficulty. Stance is normal. Gait demonstrates normal stride length and balance without use of assistive device.  Able to tandem walk and heel toe without difficulty.  Romberg negative. Reflexes: 1+ and  symmetric. Toes downgoing.     NIHSS  0 Modified Rankin  1-2      ASSESSMENT: Curtis Beltran is a 34 y.o. year old male presented with left-sided weakness on 03/02/2020 with stroke work-up revealing right MCA scattered infarcts due to right M1 occlusion s/p tPA with spontaneous recanalization, infarct embolic secondary to unknown source. Patient and family declined further testing with TEE or TCD as they felt stroke etiology was due to heavy smoking, and healthy diet and tremendous stress. Vascular risk factors include HTN, HLD, tobacco use and THC use.      PLAN:  1. Right MCA scattered infarcts:  a. Residual deficit:  Slightly decreased left hand dexterity with occasional numbness and intermittent mild occipital headaches.  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  Recommend additional 2 to 4 weeks for further recovery prior to returning.  Letter provided.  Advised to start increasing daily activity as tolerated and increasing independence.  Advise headaches should continue to improve and may continue use Tylenol as needed b. Continue aspirin 81 mg daily  and atorvastatin 40 mg daily for secondary stroke prevention.   c. Discussed secondary stroke prevention measures and importance of close PCP follow up for aggressive stroke risk factor management  2. HTN: BP goal <130/90.  Stable.  Placed on amlodipine during stroke admission.  Advised to continue to monitor at home and to ensure he establishes care with PCP. 3. HLD: LDL goal <70. Recent LDL 142.  On atorvastatin 40 mg daily.  Advised to establish care with PCP for ongoing monitoring and management (no show appt with Gwinda Passe, NP to establish care) 4. Tobacco use: Congratulated on complete tobacco cessation and highly encouraged continued abstinence 5. Dyspepsia: Unknown etiology.  No prior history of GERD.  Possibly aspirin related and advised to ensure he is using enteric-coated and to take with food.  Discussed importance of avoiding certain foods that may trigger symptoms.  May also use Tums or Pepto-Bismol for heartburn symptoms.  Advise further evaluation and management per PCP once established.    Follow up in 4 months or call earlier if needed   I spent 45 minutes of face-to-face and non-face-to-face time with patient.  This included previsit chart review including recent hospitalization pertinent progress notes, lab work and imaging, lab review, study review, order entry, electronic health record documentation, patient education regarding recent stroke, residual symptoms, dyspepsia complaint and  possible causes, importance of managing stroke risk factors and answered all other questions to patient satisfaction   Ihor Austin, AGNP-BC  [facility] [address] [place], [place] [address]  Phone [phone] Fax [phone] Note: This document was prepared with digital dictation and possible smart phrase technology. Any transcriptional errors that result from this process are unintentional.

## 2020-06-03 ENCOUNTER — Other Ambulatory Visit: Payer: Self-pay

## 2020-06-03 NOTE — Patient Outreach (Signed)
[facility]) Care Management  06/03/2020  Kyuss Hale [birth year] [id number]   Telephone outreach to patient to obtain mRS was successfully completed. MRS= 1  Thank you, [name] Care Management Assistant

## 2020-08-18 ENCOUNTER — Ambulatory Visit (INDEPENDENT_AMBULATORY_CARE_PROVIDER_SITE_OTHER): Payer: Self-pay | Admitting: Primary Care

## 2020-08-18 ENCOUNTER — Ambulatory Visit: Payer: Self-pay | Admitting: Adult Health

## 2020-10-29 ENCOUNTER — Ambulatory Visit (INDEPENDENT_AMBULATORY_CARE_PROVIDER_SITE_OTHER): Payer: Self-pay | Admitting: Primary Care

## 2020-11-05 ENCOUNTER — Other Ambulatory Visit (HOSPITAL_COMMUNITY): Payer: Self-pay

## 2020-12-05 ENCOUNTER — Inpatient Hospital Stay (HOSPITAL_COMMUNITY): Payer: Self-pay

## 2020-12-05 ENCOUNTER — Emergency Department (HOSPITAL_COMMUNITY): Payer: Self-pay

## 2020-12-05 ENCOUNTER — Encounter (HOSPITAL_COMMUNITY): Payer: Self-pay | Admitting: Neurology

## 2020-12-05 ENCOUNTER — Other Ambulatory Visit: Payer: Self-pay

## 2020-12-05 ENCOUNTER — Inpatient Hospital Stay (HOSPITAL_COMMUNITY)
Admission: EM | Admit: 2020-12-05 | Discharge: 2020-12-06 | DRG: 063 | Disposition: A | Discharge status: Home / Self Care | Payer: Self-pay | Attending: Neurology | Admitting: Neurology

## 2020-12-05 DIAGNOSIS — I10 Essential (primary) hypertension: Secondary | ICD-10-CM | POA: Diagnosis present

## 2020-12-05 DIAGNOSIS — F1210 Cannabis abuse, uncomplicated: Secondary | ICD-10-CM | POA: Diagnosis present

## 2020-12-05 DIAGNOSIS — I6601 Occlusion and stenosis of right middle cerebral artery: Secondary | ICD-10-CM | POA: Diagnosis present

## 2020-12-05 DIAGNOSIS — Z20822 Contact with and (suspected) exposure to covid-19: Secondary | ICD-10-CM | POA: Diagnosis present

## 2020-12-05 DIAGNOSIS — G459 Transient cerebral ischemic attack, unspecified: Principal | ICD-10-CM | POA: Diagnosis present

## 2020-12-05 DIAGNOSIS — F17210 Nicotine dependence, cigarettes, uncomplicated: Secondary | ICD-10-CM | POA: Diagnosis present

## 2020-12-05 DIAGNOSIS — Z9282 Status post administration of tPA (rtPA) in a different facility within the last 24 hours prior to admission to current facility: Secondary | ICD-10-CM | POA: Insufficient documentation

## 2020-12-05 DIAGNOSIS — Z720 Tobacco use: Secondary | ICD-10-CM

## 2020-12-05 DIAGNOSIS — E785 Hyperlipidemia, unspecified: Secondary | ICD-10-CM | POA: Diagnosis present

## 2020-12-05 DIAGNOSIS — I63511 Cerebral infarction due to unspecified occlusion or stenosis of right middle cerebral artery: Secondary | ICD-10-CM

## 2020-12-05 DIAGNOSIS — Z8673 Personal history of transient ischemic attack (TIA), and cerebral infarction without residual deficits: Secondary | ICD-10-CM

## 2020-12-05 DIAGNOSIS — I639 Cerebral infarction, unspecified: Secondary | ICD-10-CM

## 2020-12-05 DIAGNOSIS — I63411 Cerebral infarction due to embolism of right middle cerebral artery: Secondary | ICD-10-CM

## 2020-12-05 LAB — COMPREHENSIVE METABOLIC PANEL
ALT: 14 U/L (ref 0–44)
AST: 22 U/L (ref 15–41)
Albumin: 3.3 g/dL — ABNORMAL LOW (ref 3.5–5.0)
Alkaline Phosphatase: 51 U/L (ref 38–126)
Anion gap: 5 (ref 5–15)
BUN: 19 mg/dL (ref 6–20)
CO2: 22 mmol/L (ref 22–32)
Calcium: 8.2 mg/dL — ABNORMAL LOW (ref 8.9–10.3)
Chloride: 111 mmol/L (ref 98–111)
Creatinine, Ser: 1.27 mg/dL — ABNORMAL HIGH (ref 0.61–1.24)
GFR, Estimated: >60 mL/min (ref >60)
Glucose, Bld: 125 mg/dL — ABNORMAL HIGH (ref 70–99)
Potassium: 3.8 mmol/L (ref 3.5–5.1)
Sodium: 138 mmol/L (ref 135–145)
Total Bilirubin: 0.7 mg/dL (ref 0.3–1.2)
Total Protein: 5.2 g/dL — ABNORMAL LOW (ref 6.5–8.1)

## 2020-12-05 LAB — I-STAT CHEM 8, ED
BUN: 25 mg/dL — ABNORMAL HIGH (ref 6–20)
Calcium, Ion: 1.08 mmol/L — ABNORMAL LOW (ref 1.15–1.40)
Chloride: 109 mmol/L (ref 98–111)
Creatinine, Ser: 1.2 mg/dL (ref 0.61–1.24)
Glucose, Bld: 121 mg/dL — ABNORMAL HIGH (ref 70–99)
HCT: 44 % (ref 39.0–52.0)
Hemoglobin: 15 g/dL (ref 13.0–17.0)
Potassium: 4.2 mmol/L (ref 3.5–5.1)
Sodium: 141 mmol/L (ref 135–145)
TCO2: 25 mmol/L (ref 22–32)

## 2020-12-05 LAB — DIFFERENTIAL
Abs Immature Granulocytes: 0.02 10*3/uL (ref 0.00–0.07)
Basophils Absolute: 0 10*3/uL (ref 0.0–0.1)
Basophils Relative: 0 %
Eosinophils Absolute: 0.1 10*3/uL (ref 0.0–0.5)
Eosinophils Relative: 1 %
Immature Granulocytes: 0 %
Lymphocytes Relative: 32 %
Lymphs Abs: 1.8 10*3/uL (ref 0.7–4.0)
Monocytes Absolute: 0.3 10*3/uL (ref 0.1–1.0)
Monocytes Relative: 5 %
Neutro Abs: 3.4 10*3/uL (ref 1.7–7.7)
Neutrophils Relative %: 62 %

## 2020-12-05 LAB — CBC
HCT: 44.2 % (ref 39.0–52.0)
Hemoglobin: 14.4 g/dL (ref 13.0–17.0)
MCH: 27.4 pg (ref 26.0–34.0)
MCHC: 32.6 g/dL (ref 30.0–36.0)
MCV: 84 fL (ref 80.0–100.0)
Platelets: 199 10*3/uL (ref 150–400)
RBC: 5.26 MIL/uL (ref 4.22–5.81)
RDW: 15.9 % — ABNORMAL HIGH (ref 11.5–15.5)
WBC: 5.6 10*3/uL (ref 4.0–10.5)
nRBC: 0 % (ref 0.0–0.2)

## 2020-12-05 LAB — ECHOCARDIOGRAM COMPLETE BUBBLE STUDY
AR max vel: 2.57 cm2
AV Area VTI: 2.31 cm2
AV Area mean vel: 2.29 cm2
AV Mean grad: 4 mmHg
AV Peak grad: 7.2 mmHg
Ao pk vel: 1.34 m/s
Area-P 1/2: 3.91 cm2
MV VTI: 2.27 cm2
S' Lateral: 3.1 cm

## 2020-12-05 LAB — APTT: aPTT: 26 seconds (ref 24–36)

## 2020-12-05 LAB — PROTIME-INR
INR: 1 (ref 0.8–1.2)
Prothrombin Time: 12.7 seconds (ref 11.4–15.2)

## 2020-12-05 LAB — CBG MONITORING, ED: Glucose-Capillary: 131 mg/dL — ABNORMAL HIGH (ref 70–99)

## 2020-12-05 MED ORDER — SODIUM CHLORIDE 0.9 % IV SOLN
50.0000 mL | Freq: Once | INTRAVENOUS | Status: AC
  Administered 2020-12-05: 50 mL via INTRAVENOUS

## 2020-12-05 MED ORDER — LABETALOL HCL 5 MG/ML IV SOLN
10.0000 mg | Freq: Once | INTRAVENOUS | Status: AC
  Administered 2020-12-05: 10 mg via INTRAVENOUS

## 2020-12-05 MED ORDER — ACETAMINOPHEN 650 MG RE SUPP: 650.0000 mg | RECTAL | Status: DC | PRN

## 2020-12-05 MED ORDER — ALTEPLASE (STROKE) FULL DOSE INFUSION
0.9000 mg/kg | Freq: Once | INTRAVENOUS | Status: AC
  Administered 2020-12-05: 78.8 mg via INTRAVENOUS
  Filled 2020-12-05: qty 100

## 2020-12-05 MED ORDER — SODIUM CHLORIDE 0.9% FLUSH
3.0000 mL | Freq: Once | INTRAVENOUS | Status: AC
  Administered 2020-12-05: 3 mL via INTRAVENOUS

## 2020-12-05 MED ORDER — ACETAMINOPHEN 325 MG PO TABS
650.0000 mg | ORAL_TABLET | ORAL | Status: DC | PRN
  Administered 2020-12-05: 650 mg via ORAL
  Administered 2020-12-06: 325 mg via ORAL
  Administered 2020-12-06: 650 mg via ORAL
  Filled 2020-12-05 (×4): qty 2

## 2020-12-05 MED ORDER — LABETALOL HCL 5 MG/ML IV SOLN
INTRAVENOUS | Status: AC
  Filled 2020-12-05: qty 4

## 2020-12-05 MED ORDER — ACETAMINOPHEN 160 MG/5ML PO SOLN: 650.0000 mg | ORAL | Status: DC | PRN

## 2020-12-05 MED ORDER — IOHEXOL 350 MG/ML SOLN
80.0000 mL | Freq: Once | INTRAVENOUS | Status: AC | PRN
  Administered 2020-12-05: 80 mL via INTRAVENOUS

## 2020-12-05 MED ORDER — SENNOSIDES-DOCUSATE SODIUM 8.6-50 MG PO TABS: 1.0000 | ORAL_TABLET | Freq: Every evening | ORAL | Status: DC | PRN

## 2020-12-05 MED ORDER — ATORVASTATIN CALCIUM 40 MG PO TABS
40.0000 mg | ORAL_TABLET | Freq: Every day | ORAL | Status: DC
  Filled 2020-12-05: qty 1

## 2020-12-05 MED ORDER — IOHEXOL 350 MG/ML SOLN
40.0000 mL | Freq: Once | INTRAVENOUS | Status: AC | PRN
  Administered 2020-12-05: 40 mL via INTRAVENOUS

## 2020-12-05 MED ORDER — CHLORHEXIDINE GLUCONATE CLOTH 2 % EX PADS: 6.0000 | MEDICATED_PAD | Freq: Every day | CUTANEOUS | Status: DC

## 2020-12-05 MED ORDER — STROKE: EARLY STAGES OF RECOVERY BOOK
Freq: Once | Status: AC
  Filled 2020-12-05: qty 1

## 2020-12-05 MED ORDER — PANTOPRAZOLE SODIUM 40 MG IV SOLR
40.0000 mg | Freq: Every day | INTRAVENOUS | Status: DC
  Administered 2020-12-05: 40 mg via INTRAVENOUS
  Filled 2020-12-05: qty 40

## 2020-12-05 NOTE — Progress Notes (Signed)
  Echocardiogram 2D Echocardiogram has been performed.  Gerda Diss 12/05/2020, 4:58 PM

## 2020-12-05 NOTE — ED Notes (Signed)
The patient called out to the nurses station for concerns of needing the stroke team. The patient stated that after the echocardiogram was completed, he felt that his initial systems were triggered. He stated that he felt the same way he did whenever he came into the hospital. Paige-RN was made aware of the patient's concerns.

## 2020-12-05 NOTE — ED Triage Notes (Signed)
Pt BIB [facility] as Code Stroke, LKN [id number]. Weakness in left arm & leg, could not lift left side. No vision or speaking problems. Hx of stroke in Rt MCA last year. Pt denies pain & reports some noncompliance with his medications since last stroke. Upon arrival to ED CBG was 131.

## 2020-12-05 NOTE — Progress Notes (Signed)
Pharmacist Code Stroke Response  Notified to mix tPA at 1002 by Dr. Derry Lory Delivered tPA to RN at 1006  tPA dose = 7.9mg  bolus over 1 minute followed by 70.9mg  for a total dose of 78.8mg  over 1 hour  Issues/delays encountered (if applicable): N/A  Curtis Beltran, Curtis Beltran 12/05/20 10:18 AM

## 2020-12-05 NOTE — ED Provider Notes (Signed)
[facility] EMERGENCY DEPARTMENT Provider Note   CSN: [id number] Arrival date & time: 12/05/20  7169  An emergency department physician performed an initial assessment on this suspected stroke patient at 1000.  History Chief Complaint  Patient presents with   Code Stroke    Curtis Beltran is a 35 y.o. male.  HPI Patient presents as a code stroke.  Right-sided MCA stroke.  Has been off his aspirin and Plavix for around 8 months now.  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  Called EMS.  Last stroke he was unable to move at all now it is more difficulty using it.  Had previously received IV tPA in October of last year.  No clear cause of been found.  No headache.  No confusion.  No fevers or chills.  Last normal was 925 today.    History reviewed. No pertinent past medical history.  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 No pertinent surgical history.     Family History  Problem Relation Age of Onset   Healthy Mother    Hypertension Maternal Aunt     Social History   Tobacco Use   Smoking status: Light Smoker    Types: Cigarettes, Cigars   Smokeless tobacco: Never  Vaping Use   Vaping Use: Some days  Substance Use Topics   Alcohol use: Never   Drug use: Yes    Types: Marijuana    Home Medications Prior to Admission medications   Medication Sig Start Date End Date Taking? Authorizing Provider  Artificial Tear Ointment (DRY EYES OP)  Place 1 drop into both eyes daily as needed (dry eyes).   Yes [provider]  aspirin EC 81 MG EC tablet Take 1 tablet (81 mg total) by mouth daily. Swallow whole. 03/06/20  Yes Layne Benton, NP  ibuprofen (ADVIL) 200 MG tablet Take 200 mg by mouth every 6 (six) hours as needed for headache or mild pain.   Yes [provider]  KRILL OIL PO Take 1 tablet by mouth daily.   Yes [provider]  amLODipine (NORVASC) 10 MG tablet Take 1 tablet (10 mg total) by mouth daily. Patient not taking: Reported on 12/05/2020 03/06/20   Layne Benton, NP  atorvastatin (LIPITOR) 40 MG tablet Take 1 tablet (40 mg total) by mouth daily. Patient not taking: Reported on 12/05/2020 03/06/20   Layne Benton, NP  clopidogrel (PLAVIX) 75 MG tablet TAKE 1 TABLET (75 MG TOTAL) BY MOUTH DAILY FOR 21 DAYS. Patient not taking: Reported on 12/05/2020 03/05/20 03/05/21  Layne Benton, NP    Allergies    Patient has no known allergies.  Review of Systems   Review of Systems  HENT:  Negative for congestion.   Cardiovascular:  Negative for chest pain.  Gastrointestinal:  Negative for abdominal pain.  Genitourinary:  Negative for flank pain.  Musculoskeletal:  Negative for back pain.  Neurological:  Negative for headaches.  Psychiatric/Behavioral:  Negative for confusion.  Physical Exam Updated Vital Signs BP (!) 144/102   Pulse (!) 53   Resp 18   Wt 87.6 kg   SpO2 100%   BMI 28.52 kg/m   Physical Exam Vitals reviewed.  HENT:     Head: Atraumatic.  Eyes:     Extraocular Movements: Extraocular movements intact.  Cardiovascular:     Rate and Rhythm: Regular rhythm.  Pulmonary:     Breath sounds: No wheezing or rhonchi.  Abdominal:     Tenderness: There is no abdominal tenderness.  Musculoskeletal:        General: No tenderness.     Cervical back: Neck supple.  Skin:    General: Skin is warm.     Capillary Refill: Capillary refill takes less than 2 seconds.  Neurological:      Mental Status: He is alert.     Comments: They symmetric.  Eye movements intact.  Ataxia in left upper and lower extremity.  Gait not initially evaluated.  Complete NIH scoring done by neurology.    ED Results / Procedures / Treatments   Labs (all labs ordered are listed, but only abnormal results are displayed) Labs Reviewed  CBC - Abnormal; Notable for the following components:      Result Value   RDW 15.9 (*)    All other components within normal limits  COMPREHENSIVE METABOLIC PANEL - Abnormal; Notable for the following components:   Glucose, Bld 125 (*)    Creatinine, Ser 1.27 (*)    Calcium 8.2 (*)    Total Protein 5.2 (*)    Albumin 3.3 (*)    All other components within normal limits  I-STAT CHEM 8, ED - Abnormal; Notable for the following components:   BUN 25 (*)    Glucose, Bld 121 (*)    Calcium, Ion 1.08 (*)    All other components within normal limits  CBG MONITORING, ED - Abnormal; Notable for the following components:   Glucose-Capillary 131 (*)    All other components within normal limits  PROTIME-INR  APTT  DIFFERENTIAL    EKG None  Radiology CT HEAD CODE STROKE WO CONTRAST  Result Date: 12/05/2020 CLINICAL DATA:  Code stroke.  Ataxia left arm and leg EXAM: CT HEAD WITHOUT CONTRAST TECHNIQUE: Contiguous axial images were obtained from the base of the skull through the vertex without intravenous contrast. COMPARISON:  Brain MRI 03/03/2020, CT/CTA head 03/02/2020 FINDINGS: Brain: There is no evidence of acute intracranial hemorrhage or infarct. No mass lesion is identified. There is now midline shift. Focal hypodensity in the right centrum semiovale is likely related to prior infarct. Vascular: No hyperdense vessel or unexpected calcification. Skull: Normal. Negative for fracture or focal lesion. Sinuses/Orbits: The imaged paranasal sinuses are clear. The orbits are unremarkable. Other: None. ASPECTS [facility] Stroke Program Early CT Score) - Ganglionic level  infarction (caudate, lentiform nuclei, internal capsule, insula, M1-M3 cortex): 7 - Supraganglionic infarction (M4-M6 cortex): 3 Total score (0-10 with 10 being normal): 10 IMPRESSION: 1. No evidence of acute intracranial hemorrhage or infarct. 2. ASPECTS is 10 These results were called by telephone at the time of interpretation on 12/05/2020 at 10:15 am to provider Dr. Romilda Garret , who verbally acknowledged these results. Electronically Signed   By: Lesia Hausen MD   On: 12/05/2020 10:19   CT ANGIO HEAD CODE STROKE  Result Date: 12/05/2020 CLINICAL DATA:  Left arm and leg ataxia EXAM: CT ANGIOGRAPHY HEAD AND NECK TECHNIQUE: Multidetector CT imaging of the head and neck  was performed using the standard protocol during bolus administration of intravenous contrast. Multiplanar CT image reconstructions and MIPs were obtained to evaluate the vascular anatomy. Carotid stenosis measurements (when applicable) are obtained utilizing NASCET criteria, using the distal internal carotid diameter as the denominator. CONTRAST:  25mL OMNIPAQUE IOHEXOL 350 MG/ML SOLN COMPARISON:  Same-day noncontrast CT head, brain MRI 03/03/2020, CT/CTA head 03/02/2020 FINDINGS: CTA NECK FINDINGS Aortic arch: Standard branching. Imaged portion shows no evidence of aneurysm or dissection. No significant stenosis of the major arch vessel origins. Right carotid system: No evidence of dissection, stenosis (50% or greater) or occlusion. Left carotid system: No evidence of dissection, stenosis (50% or greater) or occlusion. Vertebral arteries: Codominant. No evidence of dissection, stenosis (50% or greater) or occlusion. Skeleton: There is no acute osseous abnormality. Other neck: The soft tissues are unremarkable. Upper chest: The lung apices are clear. Review of the MIP images confirms the above findings CTA HEAD FINDINGS Anterior circulation: The bilateral cavernous ICAs are patent. There is multifocal irregularity and narrowing of the right M1  segment with complete occlusion distally. There is reconstitution of flow within the proximal M2 branches, but there is diminished opacification distally. The left middle cerebral artery and bilateral anterior cerebral arteries are patent. Posterior circulation: The right vertebral artery is slightly diminutive compared to the left, a normal variant. The V4 segments are patent. The basilar artery and bilateral posterior cerebral arteries are patent. Venous sinuses: Patent. Review of the MIP images confirms the above findings IMPRESSION: Occluded distal right M1 segment with diminutive reconstitution of flow of the distal branches. These results were called by telephone at the time of interpretation on 12/05/2020 at 10:20 am to provider Dr. Romilda Garret , who verbally acknowledged these results. Electronically Signed   By: Lesia Hausen MD   On: 12/05/2020 10:30   CT ANGIO NECK CODE STROKE  Result Date: 12/05/2020 CLINICAL DATA:  Left arm and leg ataxia EXAM: CT ANGIOGRAPHY HEAD AND NECK TECHNIQUE: Multidetector CT imaging of the head and neck was performed using the standard protocol during bolus administration of intravenous contrast. Multiplanar CT image reconstructions and MIPs were obtained to evaluate the vascular anatomy. Carotid stenosis measurements (when applicable) are obtained utilizing NASCET criteria, using the distal internal carotid diameter as the denominator. CONTRAST:  64mL OMNIPAQUE IOHEXOL 350 MG/ML SOLN COMPARISON:  Same-day noncontrast CT head, brain MRI 03/03/2020, CT/CTA head 03/02/2020 FINDINGS: CTA NECK FINDINGS Aortic arch: Standard branching. Imaged portion shows no evidence of aneurysm or dissection. No significant stenosis of the major arch vessel origins. Right carotid system: No evidence of dissection, stenosis (50% or greater) or occlusion. Left carotid system: No evidence of dissection, stenosis (50% or greater) or occlusion. Vertebral arteries: Codominant. No evidence of  dissection, stenosis (50% or greater) or occlusion. Skeleton: There is no acute osseous abnormality. Other neck: The soft tissues are unremarkable. Upper chest: The lung apices are clear. Review of the MIP images confirms the above findings CTA HEAD FINDINGS Anterior circulation: The bilateral cavernous ICAs are patent. There is multifocal irregularity and narrowing of the right M1 segment with complete occlusion distally. There is reconstitution of flow within the proximal M2 branches, but there is diminished opacification distally. The left middle cerebral artery and bilateral anterior cerebral arteries are patent. Posterior circulation: The right vertebral artery is slightly diminutive compared to the left, a normal variant. The V4 segments are patent. The basilar artery and bilateral posterior cerebral arteries are patent. Venous sinuses: Patent. Review of the MIP images  confirms the above findings IMPRESSION: Occluded distal right M1 segment with diminutive reconstitution of flow of the distal branches. These results were called by telephone at the time of interpretation on 12/05/2020 at 10:20 am to provider Dr. Romilda GarretSal Khaliqdin , who verbally acknowledged these results. Electronically Signed   By: Lesia HausenPeter  Noone MD   On: 12/05/2020 10:30    Procedures Procedures   Medications Ordered in ED Medications  acetaminophen (TYLENOL) tablet 650 mg (has no administration in time range)    Or  acetaminophen (TYLENOL) 160 MG/5ML solution 650 mg (has no administration in time range)    Or  acetaminophen (TYLENOL) suppository 650 mg (has no administration in time range)  senna-docusate (Senokot-S) tablet 1 tablet (has no administration in time range)  pantoprazole (PROTONIX) injection 40 mg (has no administration in time range)  atorvastatin (LIPITOR) tablet 40 mg (has no administration in time range)  sodium chloride flush (NS) 0.9 % injection 3 mL (3 mLs Intravenous Given 12/05/20 1041)  alteplase (ACTIVASE) 1  mg/mL infusion SOLN 78.8 mg (0 mg Intravenous Stopped 12/05/20 1108)    Followed by  0.9 %  sodium chloride infusion (0 mLs Intravenous Stopped 12/05/20 1209)  iohexol (OMNIPAQUE) 350 MG/ML injection 80 mL (80 mLs Intravenous Contrast Given 12/05/20 1017)  labetalol (NORMODYNE) injection 10 mg (10 mg Intravenous Given 12/05/20 1036)  labetalol (NORMODYNE) 5 MG/ML injection (  Given by Other 12/05/20 1036)   stroke: mapping our early stages of recovery book ( Does not apply Given 12/05/20 1148)    ED Course  I have reviewed the triage vital signs and the nursing notes.  Pertinent labs & imaging results that were available during my care of the patient were reviewed by me and considered in my medical decision making (see chart for details).    MDM Rules/Calculators/A&P                           Patient presented as a code stroke.  Last normal around 925.  Previous stroke.  Does have deficits were potentially debilitating.  tPA given and had complete resolution of symptoms.  Initial head CT reassuring.  Admit to neuro ICU.  CRITICAL CARE Performed by: Benjiman CoreNathan Ermalinda Joubert Total critical care time: 30 minutes Critical care time was exclusive of separately billable procedures and treating other patients. Critical care was necessary to treat or prevent imminent or life-threatening deterioration. Critical care was time spent personally by me on the following activities: development of treatment plan with patient and/or surrogate as well as nursing, discussions with consultants, evaluation of patient's response to treatment, examination of patient, obtaining history from patient or surrogate, ordering and performing treatments and interventions, ordering and review of laboratory studies, ordering and review of radiographic studies, pulse oximetry and re-evaluation of patient's condition.  Final Clinical Impression(s) / ED Diagnoses Final diagnoses:  Cerebrovascular accident (CVA), unspecified mechanism [facility](HCC)     Rx / DC Orders ED Discharge Orders     None        Benjiman CorePickering, Liset Mcmonigle, MD 12/05/20 1505

## 2020-12-05 NOTE — Research (Addendum)
Patient meets criteria for OPTIMISTmain trial with tPA given and NIHSS < 10 at 2 hour assessment. No critical care needs noted.   Patient consented to data collection, discharge questions, and 90 day follow-up questions. Patient Information Sheet and Study information reviewed. Left at bedside for patient.   Donny Pique, RN BSN SCRN Stroke Response Coordinator

## 2020-12-05 NOTE — Progress Notes (Signed)
Patient with slight worsening of symptoms. He has decreawsed sensation in the left face and arm > leg. He has mild drift of the left arm. No neglect or field cut. CTH negative. Unclear whether occlusion is recurrent acute occlusion or chronic. I will get CT perfusion which could help to delineate this. Current NIHSS 3.   Ritta Slot, MD [facility] [phone]  If 7pm- 7am, please page neurology on call as listed in AMION.

## 2020-12-05 NOTE — Code Documentation (Addendum)
Pt is a 35 yr old male with history of prior Rt MCA stroke in 2021 who had a sudden onset of weakness in Left arm and leg while at work at [phone]. EMS was called, and Code Stroke was activated. Pt arrived [facility] at 0957. Airway cleared, blood drawn, CBG obtained at bridge. Pt taken to CT 2 for NCCT. Pt was ataxic in Left arm and leg. He had drift in Left Leg. (NIHSS 3). Per Neurologist, NCCT neg for acute hemorrhage. Pt had no TPA contraindications and consented to TPA. TPA given at 1007.CTA obtained. Pt able to walk without difficulty in CT area. Pt returned to room 21. Symptoms improving, ataxia milder. Pt will be monitored  with mNIHSS and VS q 15 min for 2 hours, then q 30 min for 6 hours, then hourly. Bedside handoff with Willis Modena.

## 2020-12-05 NOTE — Progress Notes (Signed)
As Pt has total resolution of symptoms, he will need a CODE STROKE activation should symptoms recur. Discussed with Pt, family, and RN Idalia Needle.

## 2020-12-05 NOTE — ED Notes (Signed)
Patient transported to CT 

## 2020-12-05 NOTE — Progress Notes (Signed)
PT Cancellation Note  Patient Details Name: [name] MRN: [id number] DOB: 1985-09-29   Cancelled Treatment:    Reason Eval/Treat Not Completed: Active bedrest order Pt currently with strict bedrest orders. Will follow up as pt appropriate and as schedule allows.   Farley Ly, PT, DPT  Acute Rehabilitation Services  Pager: [phone] Office: [phone]    Lehman Prom 12/05/2020, 11:08 AM

## 2020-12-05 NOTE — ED Notes (Signed)
Wife at bedside.

## 2020-12-05 NOTE — ED Notes (Signed)
Dahlia Client, RN from Code Stroke team was informed by this RN that pt c/o tingling in his Left hand. Dahlia Client, RN informed stroke Dr for this RN.

## 2020-12-05 NOTE — ED Notes (Addendum)
Pt has compliants of numbness and tingling in left arm and hand. Dr. Amada Jupiter made aware. Repeat head CT will be ordered.

## 2020-12-05 NOTE — H&P (Addendum)
Neurology H&P  CC: Stroke Code   History is obtained from: Patient and EMS   HPI: Curtis Beltran is a 35 y.o. male w/pmh of prior R MCA stroke, smoker who presents with left sided weakness.   He woke up this morning and went to work. While working at [employer] he developed left sided weakness at [phone] and EMS was called.   Patient states that his weakness today is different in comparison to when he last presented as he was unable to move his left arm at all at that time.   He was admitted in October of last year and worked up for right MCA stroke. At that time he presented with left sided weakness, CTA Head and Neck was notable for a distal right M1 occlusion. He received IVTPA and repeat CTA showed recanalization of the distal right M1 MCA with mild residual narrowing. ( View full work up in encounter for 03/02/20). Stroke etiology was cryptogenic.   LKW: 2620, 12/05/20 tpa given?: Yes  ICH Score: N/A, no bleed on CTH   ROS: A 14 point ROS was performed and is negative except as noted in the HPI.   Past medical history: Prior R MCA Stroke, Smoker   Family History  Problem Relation Age of Onset   Healthy Mother    Hypertension Maternal Aunt    Social History:  reports that he has been smoking cigarettes and cigars. He has never used smokeless tobacco. He reports current drug use. Drug: Marijuana. He reports that he does not drink alcohol.  Physical Exam  Constitutional: Appears well-developed and well-nourished.  Respiratory: Unlabored respirations  Cardiac: RRR   Neurological:  MS: AAOx4  Speech: Fluent, repetition and naming intact  CN: EOMI, VFF, Face symmetric, Tongue midline, Shoulder shrug intact  Motor: Normal bulk and tone. Antigravity throughout with drift to LUE and LLE.  Coordination: Ataxia LUE and LLE  Sensation: Intact to light touch throughout Gait: Deferred initially   Initial NIHSS: 5 (2 for left arm drift, 1 for left leg drift 1 for ataxia LUE 1 for  ataxia LLE)   Please note that on subsequent examinations, patients symptoms improved. NIHSS improved to a 0 after IVTPA was started. He was able to walk with noted stable gait forwards and backwards. Tandem walk was noted to be intact. Ataxia resolved.   Pertinent Imaging:  12/05/20 CT Head  Impression:  1. No evidence of acute intracranial hemorrhage or infarct. 2. ASPECTS is 10  12/05/20 CTA Head and Neck  Impression:  Occluded distal right M1 segment with diminutive reconstitution of flow of the distal branches.   Impression and Recommendations  Curtis Beltran is a 35 year old male w/pmh of prior R MCA stroke(cryptogenic etiology), current smoker who presents with left sided weakness in the setting of a  distal R M1 occlusion. He was with in the time window to receive IVTPA, and after being educated the risks and benefits of this medication he agreed to receive it. Initially, patient was offered mechanical thrombectomy in addition to IVTPA however over the course of the stroke code his symptoms improved to an NIHSS of 0. Given exam improvement it was decided to forego acute further intervention with mechanical thrombectomy.   If his neurological examination were to decline today, would offer angiogram to him again.   Recommendations:  - Admit to stroke service( they will see tomorrow, neuro will follow today)  - Neuro checks Q58m for 2 hours, then q30 m for 6 hours, then  every hour for 15 hours after every 4 hours  - Maintain SBP < 180 given he received IVTPA  - Start Aspirin for secondary stroke prevention 24 hours out from [employer]  - Start DVT prophylaxis 24 hours out from IVTPA  - Hemoglobin a1c, lipid panel ordered  - STAT Angiogram for recurrence of symptoms or new symptoms today or over the weekend. Otherwise, will get it on Monday. Discussed with Dr. Corliss Skains and he is aware. - MRI Brain WO Contrast at 24 hours. - Echocardiogram with bubble study. - PT consult, OT consult, Speech  consult - Telemetry monitoring - PT/OT/ST   HLD: - continue home Atorvastatin.  Stark Jock, NP  [facility] Nurse Practitioner  Patient seen and discussed with attending physician Dr. Derry Lory

## 2020-12-05 NOTE — ED Notes (Signed)
Echo at bedside

## 2020-12-05 NOTE — ED Notes (Signed)
Neurology back at bedside.

## 2020-12-06 ENCOUNTER — Inpatient Hospital Stay (HOSPITAL_COMMUNITY): Payer: Self-pay

## 2020-12-06 DIAGNOSIS — Z8673 Personal history of transient ischemic attack (TIA), and cerebral infarction without residual deficits: Secondary | ICD-10-CM

## 2020-12-06 DIAGNOSIS — Z720 Tobacco use: Secondary | ICD-10-CM

## 2020-12-06 DIAGNOSIS — G459 Transient cerebral ischemic attack, unspecified: Principal | ICD-10-CM

## 2020-12-06 DIAGNOSIS — I10 Essential (primary) hypertension: Secondary | ICD-10-CM

## 2020-12-06 DIAGNOSIS — F17200 Nicotine dependence, unspecified, uncomplicated: Secondary | ICD-10-CM

## 2020-12-06 LAB — BASIC METABOLIC PANEL
Anion gap: 6 (ref 5–15)
BUN: 11 mg/dL (ref 6–20)
CO2: 23 mmol/L (ref 22–32)
Calcium: 8.3 mg/dL — ABNORMAL LOW (ref 8.9–10.3)
Chloride: 108 mmol/L (ref 98–111)
Creatinine, Ser: 1.04 mg/dL (ref 0.61–1.24)
GFR, Estimated: >60 mL/min (ref >60)
Glucose, Bld: 102 mg/dL — ABNORMAL HIGH (ref 70–99)
Potassium: 3.7 mmol/L (ref 3.5–5.1)
Sodium: 137 mmol/L (ref 135–145)

## 2020-12-06 LAB — RAPID URINE DRUG SCREEN, HOSP PERFORMED
Amphetamines: NOT DETECTED
Barbiturates: NOT DETECTED
Benzodiazepines: NOT DETECTED
Cocaine: NOT DETECTED
Opiates: NOT DETECTED
Tetrahydrocannabinol: POSITIVE — AB

## 2020-12-06 LAB — MRSA NEXT GEN BY PCR, NASAL: MRSA by PCR Next Gen: NOT DETECTED

## 2020-12-06 LAB — CBC
HCT: 42.4 % (ref 39.0–52.0)
Hemoglobin: 13.9 g/dL (ref 13.0–17.0)
MCH: 26.9 pg (ref 26.0–34.0)
MCHC: 32.8 g/dL (ref 30.0–36.0)
MCV: 82.2 fL (ref 80.0–100.0)
Platelets: 200 10*3/uL (ref 150–400)
RBC: 5.16 MIL/uL (ref 4.22–5.81)
RDW: 15.6 % — ABNORMAL HIGH (ref 11.5–15.5)
WBC: 6.9 10*3/uL (ref 4.0–10.5)
nRBC: 0 % (ref 0.0–0.2)

## 2020-12-06 LAB — LIPID PANEL
Cholesterol: 219 mg/dL — ABNORMAL HIGH (ref 0–200)
HDL: 36 mg/dL — ABNORMAL LOW (ref >40)
LDL Cholesterol: 157 mg/dL — ABNORMAL HIGH (ref 0–99)
Total CHOL/HDL Ratio: 6.1 RATIO
Triglycerides: 131 mg/dL (ref <150)
VLDL: 26 mg/dL (ref 0–40)

## 2020-12-06 LAB — SARS CORONAVIRUS 2 (TAT 6-24 HRS): SARS Coronavirus 2: NEGATIVE

## 2020-12-06 LAB — HEMOGLOBIN A1C
Hgb A1c MFr Bld: 6.1 % — ABNORMAL HIGH (ref 4.8–5.6)
Mean Plasma Glucose: 128.37 mg/dL

## 2020-12-06 MED ORDER — CLOPIDOGREL BISULFATE 75 MG PO TABS
75.0000 mg | ORAL_TABLET | Freq: Every day | ORAL | Status: DC
  Administered 2020-12-06: 75 mg via ORAL
  Filled 2020-12-06: qty 1

## 2020-12-06 MED ORDER — ASPIRIN 325 MG PO TBEC: 325.0000 mg | DELAYED_RELEASE_TABLET | Freq: Every day | ORAL | 3 refills | Status: AC

## 2020-12-06 MED ORDER — CLOPIDOGREL BISULFATE 75 MG PO TABS: 75.0000 mg | ORAL_TABLET | Freq: Every day | ORAL | 0 refills | Status: DC

## 2020-12-06 MED ORDER — CHLORHEXIDINE GLUCONATE CLOTH 2 % EX PADS: 6.0000 | MEDICATED_PAD | Freq: Every day | CUTANEOUS | Status: DC

## 2020-12-06 MED ORDER — ASPIRIN EC 325 MG PO TBEC
325.0000 mg | DELAYED_RELEASE_TABLET | Freq: Every day | ORAL | Status: DC
  Administered 2020-12-06: 325 mg via ORAL
  Filled 2020-12-06: qty 1

## 2020-12-06 MED ORDER — ATORVASTATIN CALCIUM 80 MG PO TABS: 80.0000 mg | ORAL_TABLET | Freq: Every day | ORAL | Status: DC

## 2020-12-06 MED ORDER — ATORVASTATIN CALCIUM 80 MG PO TABS: 80.0000 mg | ORAL_TABLET | Freq: Every day | ORAL | 3 refills | Status: DC

## 2020-12-06 NOTE — Discharge Summary (Signed)
Stroke Discharge Summary  Patient ID: Curtis Beltran   MRN: [id number]      DOB: 1986-02-15  Date of Admission: 12/05/2020 Date of Discharge: 12/06/2020  Attending Physician:  Stroke, Md, MD, Stroke MD Consultant(s):    None  Patient's PCP:  Patient, No Pcp Per (Inactive)  DISCHARGE DIAGNOSIS:  Active Problems: TIA   Stroke symptoms s/p tPA Right MCA occlusion Smoker History of stroke HTN   Allergies as of 12/06/2020   No Known Allergies      Medication List     STOP taking these medications    ibuprofen 200 MG tablet Commonly known as: ADVIL   KRILL OIL PO       TAKE these medications    amLODipine 10 MG tablet Commonly known as: NORVASC Take 1 tablet (10 mg total) by mouth daily.   aspirin 325 MG EC tablet Take 1 tablet (325 mg total) by mouth daily. Start taking on: December 07, 2020 What changed:  medication strength how much to take additional instructions   atorvastatin 80 MG tablet Commonly known as: LIPITOR Take 1 tablet (80 mg total) by mouth at bedtime. What changed:  medication strength how much to take when to take this   clopidogrel 75 MG tablet Commonly known as: PLAVIX Take 1 tablet (75 mg total) by mouth daily. Start taking on: December 07, 2020 What changed:  how much to take how to take this when to take this   DRY EYES OP Place 1 drop into both eyes daily as needed (dry eyes).        LABORATORY STUDIES CBC    Component Value Date/Time   WBC 6.9 12/06/2020 0143   RBC 5.16 12/06/2020 0143   HGB 13.9 12/06/2020 0143   HCT 42.4 12/06/2020 0143   PLT 200 12/06/2020 0143   MCV 82.2 12/06/2020 0143   MCH 26.9 12/06/2020 0143   MCHC 32.8 12/06/2020 0143   RDW 15.6 (H) 12/06/2020 0143   LYMPHSABS 1.8 12/05/2020 1001   MONOABS 0.3 12/05/2020 1001   EOSABS 0.1 12/05/2020 1001   BASOSABS 0.0 12/05/2020 1001   CMP    Component Value Date/Time   NA 137 12/06/2020 0143   K 3.7 12/06/2020 0143   CL 108 12/06/2020 0143    CO2 23 12/06/2020 0143   GLUCOSE 102 (H) 12/06/2020 0143   BUN 11 12/06/2020 0143   CREATININE 1.04 12/06/2020 0143   CALCIUM 8.3 (L) 12/06/2020 0143   PROT 5.2 (L) 12/05/2020 1001   ALBUMIN 3.3 (L) 12/05/2020 1001   AST 22 12/05/2020 1001   ALT 14 12/05/2020 1001   ALKPHOS 51 12/05/2020 1001   BILITOT 0.7 12/05/2020 1001   GFRNONAA >60 12/06/2020 0143   COAGS Lab Results  Component Value Date   INR 1.0 12/05/2020   INR 0.9 03/02/2020   Lipid Panel    Component Value Date/Time   CHOL 219 (H) 12/06/2020 0143   TRIG 131 12/06/2020 0143   HDL 36 (L) 12/06/2020 0143   CHOLHDL 6.1 12/06/2020 0143   VLDL 26 12/06/2020 0143   LDLCALC 157 (H) 12/06/2020 0143   HgbA1C  Lab Results  Component Value Date   HGBA1C 6.1 (H) 12/06/2020   Urinalysis    Component Value Date/Time   COLORURINE STRAW (A) 03/02/2020 1530   APPEARANCEUR CLEAR 03/02/2020 1530   LABSPEC 1.010 03/02/2020 1530   PHURINE 8.0 03/02/2020 1530   GLUCOSEU NEGATIVE 03/02/2020 1530   HGBUR NEGATIVE 03/02/2020 1530  BILIRUBINUR NEGATIVE 03/02/2020 1530   KETONESUR NEGATIVE 03/02/2020 1530   PROTEINUR NEGATIVE 03/02/2020 1530   NITRITE NEGATIVE 03/02/2020 1530   LEUKOCYTESUR NEGATIVE 03/02/2020 1530   Urine Drug Screen     Component Value Date/Time   LABOPIA NONE DETECTED 12/06/2020 0941   COCAINSCRNUR NONE DETECTED 12/06/2020 0941   LABBENZ NONE DETECTED 12/06/2020 0941   AMPHETMU NONE DETECTED 12/06/2020 0941   THCU POSITIVE (A) 12/06/2020 0941   LABBARB NONE DETECTED 12/06/2020 0941    Alcohol Level    Component Value Date/Time   ETH <10 03/02/2020 0513     SIGNIFICANT DIAGNOSTIC STUDIES CT HEAD WO CONTRAST (5MM)  Result Date: 12/05/2020 CLINICAL DATA:  Stroke follow-up EXAM: CT HEAD WITHOUT CONTRAST TECHNIQUE: Contiguous axial images were obtained from the base of the skull through the vertex without intravenous contrast. COMPARISON:  None. FINDINGS: Brain: There is no mass, hemorrhage or  extra-axial collection. The size and configuration of the ventricles and extra-axial CSF spaces are normal. The brain parenchyma is normal, without acute or chronic infarction. Vascular: No abnormal hyperdensity of the major intracranial arteries or dural venous sinuses. No intracranial atherosclerosis. Skull: The visualized skull base, calvarium and extracranial soft tissues are normal. Sinuses/Orbits: No fluid levels or advanced mucosal thickening of the visualized paranasal sinuses. No mastoid or middle ear effusion. The orbits are normal. IMPRESSION: Normal head CT. Electronically Signed   By: Deatra RobinsonKevin  Herman M.D.   On: 12/05/2020 21:59   MR BRAIN WO CONTRAST  Result Date: 12/06/2020 CLINICAL DATA:  Stroke, follow up stroke EXAM: MRI HEAD WITHOUT CONTRAST TECHNIQUE: Multiplanar, multiecho pulse sequences of the brain and surrounding structures were obtained without intravenous contrast. COMPARISON:  CT head 12/05/2020. FINDINGS: Brain: No acute infarction, hemorrhage, hydrocephalus, or extra-axial fluid collection. Very mild/faint periventricular T2 hyperintensity, nonspecific but most likely related to chronic microvascular ischemic disease. Normal position of the cerebellar tonsils. There is a heterogeneously T1 hyperintense and T2 hyperintense lesion in the left aspect of the pituitary, potentially measuring up to 7 mm (see series 6, image 20; series 9, image 12; series 17, image 19). No suprasellar extension. Normal optic chiasm. Vascular: Major arterial flow voids are maintained at the skull base. Skull and upper cervical spine: Normal marrow signal. Sinuses/Orbits: Minimal ethmoid air cell mucosal thickening. Otherwise, clear sinuses. Unremarkable orbits. Other: No mastoid effusions. IMPRESSION: 1. No evidence of acute intracranial abnormality. Specifically, no acute infarct. 2. Mild chronic microvascular ischemic disease. 3. 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 Recommend correlation with pituitary function labs and pituitary protocol MRI with contrast to further evaluate. Electronically Signed   By: Feliberto HartsFrederick S Jones MD   On: 12/06/2020 10:27   CT CEREBRAL PERFUSION W CONTRAST  Result Date: 12/05/2020 CLINICAL DATA:  Left arm and leg ataxia EXAM: CT PERFUSION BRAIN TECHNIQUE: Multiphase CT imaging of the brain was performed following IV bolus contrast injection. Subsequent parametric perfusion maps were calculated using RAPID software. CONTRAST:  40mL OMNIPAQUE IOHEXOL 350 MG/ML SOLN COMPARISON:  None. FINDINGS: CT Brain Perfusion Findings: CBF (<30%) Volume: 0mL Perfusion (Tmax>6.0s) volume: 7mL Mismatch Volume: 7mL ASPECTS on noncontrast CT Head: 10 at 10:19 a.m. today. Infarct Core: 0 mL Infarction Location:No completed infarction. Area of ischemic penumbra in the right MCA territory. IMPRESSION: No completed infarction by CT perfusion. 7 mL area of ischemic penumbra in the right MCA territory. Electronically Signed   By: Deatra RobinsonKevin  Herman M.D.   On:  12/05/2020 23:46   ECHOCARDIOGRAM COMPLETE BUBBLE STUDY  Result Date: 12/05/2020    ECHOCARDIOGRAM REPORT   Patient Name:   Curtis Beltran Date of Exam: 12/05/2020 Medical Rec #:  [id number]       Height:       69.0 in Accession #:    [id number]      Weight:       193.1 lb Date of Birth:  28-Oct-1985       BSA:          2.036 m Patient Age:    34 years        BP:           140/94 mmHg Patient Gender: M               HR:           58 bpm. Exam Location:  Inpatient Procedure: 2D Echo, Cardiac Doppler, Color Doppler and Saline Contrast Bubble            Study Indications:    Stroke  History:        Patient has prior history of Echocardiogram examinations, most                 recent 03/02/2020. Risk Factors:Hypertension and Current Smoker.  Sonographer:    Ross Ludwig RDCS (AE) Referring Phys: [id number] [facility] IMPRESSIONS  1. Left ventricular ejection fraction, by estimation, is 60  to 65%. The left ventricle has normal function. The left ventricle has no regional wall motion abnormalities. Left ventricular diastolic parameters were normal.  2. Right ventricular systolic function is normal. The right ventricular size is normal.  3. The mitral valve is normal in structure. Trivial mitral valve regurgitation.  4. The aortic valve is normal in structure. Aortic valve regurgitation is not visualized. No aortic stenosis is present. FINDINGS  Left Ventricle: Left ventricular ejection fraction, by estimation, is 60 to 65%. The left ventricle has normal function. The left ventricle has no regional wall motion abnormalities. The left ventricular internal cavity size was normal in size. There is  no left ventricular hypertrophy. Left ventricular diastolic parameters were normal. Right Ventricle: The right ventricular size is normal. Right vetricular wall thickness was not well visualized. Right ventricular systolic function is normal. Left Atrium: Left atrial size was normal in size. Right Atrium: Right atrial size was normal in size. Pericardium: There is no evidence of pericardial effusion. Mitral Valve: The mitral valve is normal in structure. Trivial mitral valve regurgitation. MV peak gradient, 3.1 mmHg. The mean mitral valve gradient is 1.0 mmHg. Tricuspid Valve: The tricuspid valve is normal in structure. Tricuspid valve regurgitation is trivial. Aortic Valve: The aortic valve is normal in structure. Aortic valve regurgitation is not visualized. No aortic stenosis is present. Aortic valve mean gradient measures 4.0 mmHg. Aortic valve peak gradient measures 7.2 mmHg. Aortic valve area, by VTI measures 2.31 cm. Pulmonic Valve: The pulmonic valve was normal in structure. Pulmonic valve regurgitation is not visualized. Aorta: The aortic root and ascending aorta are structurally normal, with no evidence of dilitation. IAS/Shunts: No atrial level shunt detected by color flow Doppler. Agitated saline  contrast was given intravenously to evaluate for intracardiac shunting.  LEFT VENTRICLE PLAX 2D LVIDd:         4.80 cm  Diastology LVIDs:         3.10 cm  LV e' medial:    10.20 cm/s LV PW:         1.30  cm  LV E/e' medial:  7.5 LV IVS:        1.20 cm  LV e' lateral:   16.90 cm/s LVOT diam:     2.20 cm  LV E/e' lateral: 4.5 LV SV:         64 LV SV Index:   31 LVOT Area:     3.80 cm  RIGHT VENTRICLE             IVC RV Basal diam:  3.10 cm     IVC diam: 1.90 cm RV S prime:     14.60 cm/s TAPSE (M-mode): 2.2 cm LEFT ATRIUM             Index       RIGHT ATRIUM           Index LA diam:        3.90 cm 1.92 cm/m  RA Area:     15.30 cm LA Vol (A2C):   49.7 ml 24.42 ml/m RA Volume:   38.40 ml  18.87 ml/m LA Vol (A4C):   60.4 ml 29.67 ml/m LA Biplane Vol: 60.6 ml 29.77 ml/m  AORTIC VALVE AV Area (Vmax):    2.57 cm AV Area (Vmean):   2.29 cm AV Area (VTI):     2.31 cm AV Vmax:           134.00 cm/s AV Vmean:          90.300 cm/s AV VTI:            0.276 m AV Peak Grad:      7.2 mmHg AV Mean Grad:      4.0 mmHg LVOT Vmax:         90.70 cm/s LVOT Vmean:        54.500 cm/s LVOT VTI:          0.168 m LVOT/AV VTI ratio: 0.61  AORTA Ao Root diam: 3.20 cm Ao Asc diam:  2.90 cm MITRAL VALVE MV Area (PHT): 3.91 cm    SHUNTS MV Area VTI:   2.27 cm    Systemic VTI:  0.17 m MV Peak grad:  3.1 mmHg    Systemic Diam: 2.20 cm MV Mean grad:  1.0 mmHg MV Vmax:       0.88 m/s MV Vmean:      39.0 cm/s MV Decel Time: 194 msec MV E velocity: 76.50 cm/s MV A velocity: 60.80 cm/s MV E/A ratio:  1.26 Kristeen Miss MD Electronically signed by Kristeen Miss MD Signature Date/Time: 12/05/2020/5:44:10 PM    Final    CT HEAD CODE STROKE WO CONTRAST  Result Date: 12/05/2020 CLINICAL DATA:  Code stroke.  Ataxia left arm and leg EXAM: CT HEAD WITHOUT CONTRAST TECHNIQUE: Contiguous axial images were obtained from the base of the skull through the vertex without intravenous contrast. COMPARISON:  Brain MRI 03/03/2020, CT/CTA head 03/02/2020  FINDINGS: Brain: There is no evidence of acute intracranial hemorrhage or infarct. No mass lesion is identified. There is now midline shift. Focal hypodensity in the right centrum semiovale is likely related to prior infarct. Vascular: No hyperdense vessel or unexpected calcification. Skull: Normal. Negative for fracture or focal lesion. Sinuses/Orbits: The imaged paranasal sinuses are clear. The orbits are unremarkable. Other: None. ASPECTS [facility] Stroke Program Early CT Score) - Ganglionic level infarction (caudate, lentiform nuclei, internal capsule, insula, M1-M3 cortex): 7 - Supraganglionic infarction (M4-M6 cortex): 3 Total score (0-10 with 10 being normal): 10 IMPRESSION: 1. No evidence of acute intracranial  hemorrhage or infarct. 2. ASPECTS is 10 These results were called by telephone at the time of interpretation on 12/05/2020 at 10:15 am to provider Dr. Romilda Garret , who verbally acknowledged these results. Electronically Signed   By: Lesia Hausen MD   On: 12/05/2020 10:19   CT ANGIO HEAD CODE STROKE  Result Date: 12/05/2020 CLINICAL DATA:  Left arm and leg ataxia EXAM: CT ANGIOGRAPHY HEAD AND NECK TECHNIQUE: Multidetector CT imaging of the head and neck was performed using the standard protocol during bolus administration of intravenous contrast. Multiplanar CT image reconstructions and MIPs were obtained to evaluate the vascular anatomy. Carotid stenosis measurements (when applicable) are obtained utilizing NASCET criteria, using the distal internal carotid diameter as the denominator. CONTRAST:  57mL OMNIPAQUE IOHEXOL 350 MG/ML SOLN COMPARISON:  Same-day noncontrast CT head, brain MRI 03/03/2020, CT/CTA head 03/02/2020 FINDINGS: CTA NECK FINDINGS Aortic arch: Standard branching. Imaged portion shows no evidence of aneurysm or dissection. No significant stenosis of the major arch vessel origins. Right carotid system: No evidence of dissection, stenosis (50% or greater) or occlusion. Left carotid  system: No evidence of dissection, stenosis (50% or greater) or occlusion. Vertebral arteries: Codominant. No evidence of dissection, stenosis (50% or greater) or occlusion. Skeleton: There is no acute osseous abnormality. Other neck: The soft tissues are unremarkable. Upper chest: The lung apices are clear. Review of the MIP images confirms the above findings CTA HEAD FINDINGS Anterior circulation: The bilateral cavernous ICAs are patent. There is multifocal irregularity and narrowing of the right M1 segment with complete occlusion distally. There is reconstitution of flow within the proximal M2 branches, but there is diminished opacification distally. The left middle cerebral artery and bilateral anterior cerebral arteries are patent. Posterior circulation: The right vertebral artery is slightly diminutive compared to the left, a normal variant. The V4 segments are patent. The basilar artery and bilateral posterior cerebral arteries are patent. Venous sinuses: Patent. Review of the MIP images confirms the above findings IMPRESSION: Occluded distal right M1 segment with diminutive reconstitution of flow of the distal branches. These results were called by telephone at the time of interpretation on 12/05/2020 at 10:20 am to provider Dr. Romilda Garret , who verbally acknowledged these results. Electronically Signed   By: Lesia Hausen MD   On: 12/05/2020 10:30   CT ANGIO NECK CODE STROKE  Result Date: 12/05/2020 CLINICAL DATA:  Left arm and leg ataxia EXAM: CT ANGIOGRAPHY HEAD AND NECK TECHNIQUE: Multidetector CT imaging of the head and neck was performed using the standard protocol during bolus administration of intravenous contrast. Multiplanar CT image reconstructions and MIPs were obtained to evaluate the vascular anatomy. Carotid stenosis measurements (when applicable) are obtained utilizing NASCET criteria, using the distal internal carotid diameter as the denominator. CONTRAST:  25mL OMNIPAQUE IOHEXOL 350  MG/ML SOLN COMPARISON:  Same-day noncontrast CT head, brain MRI 03/03/2020, CT/CTA head 03/02/2020 FINDINGS: CTA NECK FINDINGS Aortic arch: Standard branching. Imaged portion shows no evidence of aneurysm or dissection. No significant stenosis of the major arch vessel origins. Right carotid system: No evidence of dissection, stenosis (50% or greater) or occlusion. Left carotid system: No evidence of dissection, stenosis (50% or greater) or occlusion. Vertebral arteries: Codominant. No evidence of dissection, stenosis (50% or greater) or occlusion. Skeleton: There is no acute osseous abnormality. Other neck: The soft tissues are unremarkable. Upper chest: The lung apices are clear. Review of the MIP images confirms the above findings CTA HEAD FINDINGS Anterior circulation: The bilateral cavernous ICAs are patent.  There is multifocal irregularity and narrowing of the right M1 segment with complete occlusion distally. There is reconstitution of flow within the proximal M2 branches, but there is diminished opacification distally. The left middle cerebral artery and bilateral anterior cerebral arteries are patent. Posterior circulation: The right vertebral artery is slightly diminutive compared to the left, a normal variant. The V4 segments are patent. The basilar artery and bilateral posterior cerebral arteries are patent. Venous sinuses: Patent. Review of the MIP images confirms the above findings IMPRESSION: Occluded distal right M1 segment with diminutive reconstitution of flow of the distal branches. These results were called by telephone at the time of interpretation on 12/05/2020 at 10:20 am to provider Dr. Romilda Garret , who verbally acknowledged these results. Electronically Signed   By: Lesia Hausen MD   On: 12/05/2020 10:30      HISTORY OF PRESENT ILLNESS Curtis Beltran is a 35 y.o. male w/pmh of prior R MCA stroke, smoker who presents with left sided weakness.   He woke up this morning and went to work.  While working at [employer] he developed left sided weakness at [phone] and EMS was called.   Patient states that his weakness today is different in comparison to when he last presented as he was unable to move his left arm at all at that time.   He was admitted in October of last year and worked up for right MCA stroke. At that time he presented with left sided weakness, CTA Head and Neck was notable for a distal right M1 occlusion. He received IVTPA and repeat CTA showed recanalization of the distal right M1 MCA with mild residual narrowing. ( View full work up in encounter for 03/02/20). Stroke etiology was cryptogenic.    LKW: 9604, 12/05/20 tpa given?: Yes  ICH Score: N/A, no bleed on [facility]    HOSPITAL COURSE Mr. Curtis Beltran is a 35 y.o. male with history of smoking, stroke in 03/2020 admitted for left UE and LE ataxia. TPA given.   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 Chronic CT no acute finding CTA head and neck right M1 occlusion with good collateral MRI  no acute infarct 2D Echo  EF 60-65%, no PFO Patient again refused TEE this time He refused cerebral angiogram although with good understanding of the purpose and benefit of the procedure LDL 157 HgbA1c 6.1 UDS positive for THC SCDs for VTE prophylaxis No antithrombotic prior to admission, now on aspirin 325 mg daily and clopidogrel 75 mg daily DAPT for 3 months and then ASA alone.  Patient counseled to be compliant with his antithrombotic medications Ongoing aggressive stroke risk factor management Therapy recommendations:  none Disposition:  pending   History of stroke 03/2020 admitted for left-sided weakness.  CT negative.  Status post tPA.  CTA head and neck showed right M1 occlusion with CTP positive for 34 cc of penumbra.  Repeat CTA 1.5 hours later right M1 reconstituted.  MRI showed small right MCA infarct.  DVT negative.  EF 60 to 65%.   Patient refused TEE and TCD bubble study.  LDL 142, A1c 5.9.  UDS positive for THC.  Hypercoagulable work-up negative.  Discharged with DAPT and Lipitor 40. Patient states that he has relapsed on smoking cigarette, not eating healthy and not compliant with medication.   Hypertension Stable on the high end Will resume norvasc at discharge Avoid low BP Check BP at  home frequently, ideally once a day Long term BP goal 1 30-1 50 given right MCA occlusion.   Hyperlipidemia Home meds:  none  LDL 157, goal < 70 Now on Lipitor 80 Continue statin at discharge   Tobacco abuse Current smoker with relapse Smoking cessation counseling provided Pt is willing to quit     Other Stroke Risk Factors unhealthy diet THC abuse, UDS again showed positive for [facility], education for cessation provided.   Other Active Problems    DISCHARGE EXAM Blood pressure (!) 158/106, pulse 65, temperature (!) 97.4 F (36.3 C), temperature source Oral, resp. rate (!) 22, weight 87.6 kg, SpO2 100 %.  General - Well nourished, well developed, in no apparent distress.   Ophthalmologic - fundi not visualized due to noncooperation.   Cardiovascular - Regular rhythm and rate.   Mental Status - Level of arousal and orientation to time, place, and person were intact. Language including expression, naming, repetition, comprehension was assessed and found intact. Attention span and concentration were normal. Recent and remote memory were intact. Fund of Knowledge was assessed and was intact.   Cranial Nerves II - XII - II - Visual field intact OU. III, IV, VI - Extraocular movements intact. V - Facial sensation intact bilaterally. VII - Facial movement intact bilaterally. VIII - Hearing & vestibular intact bilaterally. X - Palate elevates symmetrically. XI - Chin turning & shoulder shrug intact bilaterally. XII - Tongue protrusion intact.   Motor Strength - The patient's strength was normal in all extremities  and pronator drift was absent.  Bulk was normal and fasciculations were absent.   Motor Tone - Muscle tone was assessed at the neck and appendages and was normal.   Reflexes - The patient's reflexes were symmetrical in all extremities and he had no pathological reflexes.   Sensory - Light touch, temperature/pinprick were assessed and were symmetrical.     Coordination - The patient had normal movements in the hands and feet with no ataxia or dysmetria.  Tremor was absent.   Gait and Station - deferred  Discharge Diet       Diet   Diet Heart Room service appropriate? Yes; Fluid consistency: Thin   liquids  DISCHARGE PLAN Disposition:  home ASA 325 and plavix 75 DAPT for secondary stroke prevention for 3 months then ASA alone. Ongoing stroke risk factor control by Primary Care Physician at time of discharge Follow-up PCP in 1-2 weeks. Follow-up in [facility] in 4 weeks, office to schedule an appointment.   35 minutes were spent preparing discharge.  Marvel Plan, MD PhD Stroke Neurology 12/06/2020 4:41 PM

## 2020-12-06 NOTE — TOC Transition Note (Signed)
Transition of Care [facility]) - CM/SW Discharge Note   Patient Details  Name: Curtis Beltran MRN: [id number] Date of Birth: 1985-08-22  Transition of Care [facility]) CM/SW Contact:  Bess Kinds, RN Phone Number: [phone] 12/06/2020, 5:18 PM   Clinical Narrative:     Spoke with patient on his mobile phone to discuss transition home. Reviewed discharge medications. Discussed using [facility] for prescriptions, since he does not have insurance. Advised that [facility] is usually more cost effective. Patient expressed appreciation and stated that he will ask [facility] to transfer his prescription. Provided contact information for [facility]. No further TOC needs identified.  Final next level of care: Home/Self Care Barriers to Discharge: No Barriers Identified   Patient Goals and CMS Choice Patient states their goals for this hospitalization and ongoing recovery are:: return home CMS Medicare.gov Compare Post Acute Care list provided to:: Patient Choice offered to / list presented to : NA  Discharge Placement                       Discharge Plan and Services                DME Arranged: N/A DME Agency: NA       HH Arranged: NA HH Agency: NA        Social Determinants of Health (SDOH) Interventions     Readmission Risk Interventions No flowsheet data found.

## 2020-12-06 NOTE — Discharge Instructions (Signed)
Take the medication as prescribed. ASA 325mg  daily and lipitor 80mg  daily, continues everyday. Also take plavix 75mg  daily for 3 months only.  Quit smoking Check BP frequently, BP goal 130-150, avoid low BP.  Follow up with your primary care doctor in 1-2 weeks. Will set up appointment to follow up with neurology clinic.  Compliant with medication Eat healthy and regular exercise.  If you have stroke symptoms again, please call 911.

## 2020-12-06 NOTE — Progress Notes (Signed)
STROKE TEAM PROGRESS NOTE   SUBJECTIVE (INTERVAL HISTORY) His RN is at the bedside.  Overall his condition is completely resolved. Pt came in yesterday for right UE and LE ataxia, s/p tPA and symptoms resolved. Then at night, he again had left arm drift and numbness, CTP no significant penumbra. This morning pt symptoms again resolved. BP 150s. Discussed with further work up, and he so far still refuse TEE but will think about cerebral angiogram on Monday.    OBJECTIVE Temp:  [97.8 F (36.6 C)-98.4 F (36.9 C)] 98.1 F (36.7 C) (08/06 1200) Pulse Rate:  [48-68] 53 (08/06 1200) Resp:  [10-23] 21 (08/06 1200) BP: (107-174)/(74-125) 152/103 (08/06 1200) SpO2:  [85 %-100 %] 100 % (08/06 1200)  Recent Labs  Lab 12/05/20 0959  GLUCAP 131*   Recent Labs  Lab 12/05/20 1001 12/05/20 1008 12/06/20 0143  NA 138 141 137  K 3.8 4.2 3.7  CL 111 109 108  CO2 22  --  23  GLUCOSE 125* 121* 102*  BUN 19 25* 11  CREATININE 1.27* 1.20 1.04  CALCIUM 8.2*  --  8.3*   Recent Labs  Lab 12/05/20 1001  AST 22  ALT 14  ALKPHOS 51  BILITOT 0.7  PROT 5.2*  ALBUMIN 3.3*   Recent Labs  Lab 12/05/20 1001 12/05/20 1008 12/06/20 0143  WBC 5.6  --  6.9  NEUTROABS 3.4  --   --   HGB 14.4 15.0 13.9  HCT 44.2 44.0 42.4  MCV 84.0  --  82.2  PLT 199  --  200   No results for input(s): CKTOTAL, CKMB, CKMBINDEX, TROPONINI in the last 168 hours. Recent Labs    12/05/20 1001  LABPROT 12.7  INR 1.0   No results for input(s): COLORURINE, LABSPEC, PHURINE, GLUCOSEU, HGBUR, BILIRUBINUR, KETONESUR, PROTEINUR, UROBILINOGEN, NITRITE, LEUKOCYTESUR in the last 72 hours.  Invalid input(s): APPERANCEUR     Component Value Date/Time   CHOL 219 (H) 12/06/2020 0143   TRIG 131 12/06/2020 0143   HDL 36 (L) 12/06/2020 0143   CHOLHDL 6.1 12/06/2020 0143   VLDL 26 12/06/2020 0143   LDLCALC 157 (H) 12/06/2020 0143   Lab Results  Component Value Date   HGBA1C 6.1 (H) 12/06/2020      Component Value  Date/Time   LABOPIA NONE DETECTED 12/06/2020 0941   COCAINSCRNUR NONE DETECTED 12/06/2020 0941   LABBENZ NONE DETECTED 12/06/2020 0941   AMPHETMU NONE DETECTED 12/06/2020 0941   THCU POSITIVE (A) 12/06/2020 0941   LABBARB NONE DETECTED 12/06/2020 0941    No results for input(s): ETH in the last 168 hours.  I have personally reviewed the radiological images below and agree with the radiology interpretations.  CT HEAD WO CONTRAST ( )  Result Date: 12/05/2020 CLINICAL DATA:  Stroke follow-up EXAM: CT HEAD WITHOUT CONTRAST TECHNIQUE: Contiguous axial images were obtained from the base of the skull through the vertex without intravenous contrast. COMPARISON:  None. FINDINGS: Brain: There is no mass, hemorrhage or extra-axial collection. The size and configuration of the ventricles and extra-axial CSF spaces are normal. The brain parenchyma is normal, without acute or chronic infarction. Vascular: No abnormal hyperdensity of the major intracranial arteries or dural venous sinuses. No intracranial atherosclerosis. Skull: The visualized skull base, calvarium and extracranial soft tissues are normal. Sinuses/Orbits: No fluid levels or advanced mucosal thickening of the visualized paranasal sinuses. No mastoid or middle ear effusion. The orbits are normal. IMPRESSION: Normal head CT. Electronically Signed   By: Deatra Robinson  M.D.   On: 12/05/2020 21:59   MR BRAIN WO CONTRAST  Result Date: 12/06/2020 CLINICAL DATA:  Stroke, follow up stroke EXAM: MRI HEAD WITHOUT CONTRAST TECHNIQUE: Multiplanar, multiecho pulse sequences of the brain and surrounding structures were obtained without intravenous contrast. COMPARISON:  CT head 12/05/2020. FINDINGS: Brain: No acute infarction, hemorrhage, hydrocephalus, or extra-axial fluid collection. Very mild/faint periventricular T2 hyperintensity, nonspecific but most likely related to chronic microvascular ischemic disease. Normal position of the cerebellar tonsils. There  is a heterogeneously T1 hyperintense and T2 hyperintense lesion in the left aspect of the pituitary, potentially measuring up to 7 mm (see series 6, image 20; series 9, image 12; series 17, image 19). No suprasellar extension. Normal optic chiasm. Vascular: Major arterial flow voids are maintained at the skull base. Skull and upper cervical spine: Normal marrow signal. Sinuses/Orbits: Minimal ethmoid air cell mucosal thickening. Otherwise, clear sinuses. Unremarkable orbits. Other: No mastoid effusions. IMPRESSION: 1. No evidence of acute intracranial abnormality. Specifically, no acute infarct. 2. Mild chronic microvascular ischemic disease. 3. 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 Recommend correlation with pituitary function labs and pituitary protocol MRI with contrast to further evaluate. Electronically Signed   By: Feliberto Harts MD   On: 12/06/2020 10:27   CT CEREBRAL PERFUSION W CONTRAST  Result Date: 12/05/2020 CLINICAL DATA:  Left arm and leg ataxia EXAM: CT PERFUSION BRAIN TECHNIQUE: Multiphase CT imaging of the brain was performed following IV bolus contrast injection. Subsequent parametric perfusion maps were calculated using RAPID software. CONTRAST:  40mL OMNIPAQUE IOHEXOL 350 MG/ML SOLN COMPARISON:  None. FINDINGS: CT Brain Perfusion Findings: CBF (<30%) Volume: 0mL Perfusion (Tmax>6.0s) volume: 7mL Mismatch Volume: 7mL ASPECTS on noncontrast CT Head: 10 at 10:19 a.m. today. Infarct Core: 0 mL Infarction Location:No completed infarction. Area of ischemic penumbra in the right MCA territory. IMPRESSION: No completed infarction by CT perfusion. 7 mL area of ischemic penumbra in the right MCA territory. Electronically Signed   By: Deatra Robinson M.D.   On: 12/05/2020 23:46   ECHOCARDIOGRAM COMPLETE BUBBLE STUDY  Result Date: 12/05/2020    ECHOCARDIOGRAM REPORT   Patient Name:   BREYLEN AGYEMAN Date of Exam: 12/05/2020  Medical Rec #:  [id number]       Height:       69.0 in Accession #:    [id number]      Weight:       193.1 lb Date of Birth:  April 10, 1986       BSA:          2.036 m Patient Age:    34 years        BP:           140/94 mmHg Patient Gender: M               HR:           58 bpm. Exam Location:  Inpatient Procedure: 2D Echo, Cardiac Doppler, Color Doppler and Saline Contrast Bubble            Study Indications:    Stroke  History:        Patient has prior history of Echocardiogram examinations, most                 recent 03/02/2020. Risk Factors:Hypertension and Current Smoker.  Sonographer:    Ross Ludwig RDCS (AE) Referring Phys: [id number] [facility] IMPRESSIONS  1. Left ventricular ejection fraction,  by estimation, is 60 to 65%. The left ventricle has normal function. The left ventricle has no regional wall motion abnormalities. Left ventricular diastolic parameters were normal.  2. Right ventricular systolic function is normal. The right ventricular size is normal.  3. The mitral valve is normal in structure. Trivial mitral valve regurgitation.  4. The aortic valve is normal in structure. Aortic valve regurgitation is not visualized. No aortic stenosis is present. FINDINGS  Left Ventricle: Left ventricular ejection fraction, by estimation, is 60 to 65%. The left ventricle has normal function. The left ventricle has no regional wall motion abnormalities. The left ventricular internal cavity size was normal in size. There is  no left ventricular hypertrophy. Left ventricular diastolic parameters were normal. Right Ventricle: The right ventricular size is normal. Right vetricular wall thickness was not well visualized. Right ventricular systolic function is normal. Left Atrium: Left atrial size was normal in size. Right Atrium: Right atrial size was normal in size. Pericardium: There is no evidence of pericardial effusion. Mitral Valve: The mitral valve is normal in structure. Trivial mitral valve regurgitation. MV  peak gradient, 3.1 mmHg. The mean mitral valve gradient is 1.0 mmHg. Tricuspid Valve: The tricuspid valve is normal in structure. Tricuspid valve regurgitation is trivial. Aortic Valve: The aortic valve is normal in structure. Aortic valve regurgitation is not visualized. No aortic stenosis is present. Aortic valve mean gradient measures 4.0 mmHg. Aortic valve peak gradient measures 7.2 mmHg. Aortic valve area, by VTI measures 2.31 cm. Pulmonic Valve: The pulmonic valve was normal in structure. Pulmonic valve regurgitation is not visualized. Aorta: The aortic root and ascending aorta are structurally normal, with no evidence of dilitation. IAS/Shunts: No atrial level shunt detected by color flow Doppler. Agitated saline contrast was given intravenously to evaluate for intracardiac shunting.  LEFT VENTRICLE PLAX 2D LVIDd:         4.80 cm  Diastology LVIDs:         3.10 cm  LV e' medial:    10.20 cm/s LV PW:         1.30 cm  LV E/e' medial:  7.5 LV IVS:        1.20 cm  LV e' lateral:   16.90 cm/s LVOT diam:     2.20 cm  LV E/e' lateral: 4.5 LV SV:         64 LV SV Index:   31 LVOT Area:     3.80 cm  RIGHT VENTRICLE             IVC RV Basal diam:  3.10 cm     IVC diam: 1.90 cm RV S prime:     14.60 cm/s TAPSE (M-mode): 2.2 cm LEFT ATRIUM             Index       RIGHT ATRIUM           Index LA diam:        3.90 cm 1.92 cm/m  RA Area:     15.30 cm LA Vol (A2C):   49.7 ml 24.42 ml/m RA Volume:   38.40 ml  18.87 ml/m LA Vol (A4C):   60.4 ml 29.67 ml/m LA Biplane Vol: 60.6 ml 29.77 ml/m  AORTIC VALVE AV Area (Vmax):    2.57 cm AV Area (Vmean):   2.29 cm AV Area (VTI):     2.31 cm AV Vmax:           134.00 cm/s AV Vmean:  90.300 cm/s AV VTI:            0.276 m AV Peak Grad:      7.2 mmHg AV Mean Grad:      4.0 mmHg LVOT Vmax:         90.70 cm/s LVOT Vmean:        54.500 cm/s LVOT VTI:          0.168 m LVOT/AV VTI ratio: 0.61  AORTA Ao Root diam: 3.20 cm Ao Asc diam:  2.90 cm MITRAL VALVE MV Area (PHT):  3.91 cm    SHUNTS MV Area VTI:   2.27 cm    Systemic VTI:  0.17 m MV Peak grad:  3.1 mmHg    Systemic Diam: 2.20 cm MV Mean grad:  1.0 mmHg MV Vmax:       0.88 m/s MV Vmean:      39.0 cm/s MV Decel Time: 194 msec MV E velocity: 76.50 cm/s MV A velocity: 60.80 cm/s MV E/A ratio:  1.26 Kristeen Miss MD Electronically signed by Kristeen Miss MD Signature Date/Time: 12/05/2020/5:44:10 PM    Final    CT HEAD CODE STROKE WO CONTRAST  Result Date: 12/05/2020 CLINICAL DATA:  Code stroke.  Ataxia left arm and leg EXAM: CT HEAD WITHOUT CONTRAST TECHNIQUE: Contiguous axial images were obtained from the base of the skull through the vertex without intravenous contrast. COMPARISON:  Brain MRI 03/03/2020, CT/CTA head 03/02/2020 FINDINGS: Brain: There is no evidence of acute intracranial hemorrhage or infarct. No mass lesion is identified. There is now midline shift. Focal hypodensity in the right centrum semiovale is likely related to prior infarct. Vascular: No hyperdense vessel or unexpected calcification. Skull: Normal. Negative for fracture or focal lesion. Sinuses/Orbits: The imaged paranasal sinuses are clear. The orbits are unremarkable. Other: None. ASPECTS [facility] Stroke Program Early CT Score) - Ganglionic level infarction (caudate, lentiform nuclei, internal capsule, insula, M1-M3 cortex): 7 - Supraganglionic infarction (M4-M6 cortex): 3 Total score (0-10 with 10 being normal): 10 IMPRESSION: 1. No evidence of acute intracranial hemorrhage or infarct. 2. ASPECTS is 10 These results were called by telephone at the time of interpretation on 12/05/2020 at 10:15 am to provider Dr. Romilda Garret , who verbally acknowledged these results. Electronically Signed   By: Lesia Hausen MD   On: 12/05/2020 10:19   CT ANGIO HEAD CODE STROKE  Result Date: 12/05/2020 CLINICAL DATA:  Left arm and leg ataxia EXAM: CT ANGIOGRAPHY HEAD AND NECK TECHNIQUE: Multidetector CT imaging of the head and neck was performed using the standard  protocol during bolus administration of intravenous contrast. Multiplanar CT image reconstructions and MIPs were obtained to evaluate the vascular anatomy. Carotid stenosis measurements (when applicable) are obtained utilizing NASCET criteria, using the distal internal carotid diameter as the denominator. CONTRAST:  91mL OMNIPAQUE IOHEXOL 350 MG/ML SOLN COMPARISON:  Same-day noncontrast CT head, brain MRI 03/03/2020, CT/CTA head 03/02/2020 FINDINGS: CTA NECK FINDINGS Aortic arch: Standard branching. Imaged portion shows no evidence of aneurysm or dissection. No significant stenosis of the major arch vessel origins. Right carotid system: No evidence of dissection, stenosis (50% or greater) or occlusion. Left carotid system: No evidence of dissection, stenosis (50% or greater) or occlusion. Vertebral arteries: Codominant. No evidence of dissection, stenosis (50% or greater) or occlusion. Skeleton: There is no acute osseous abnormality. Other neck: The soft tissues are unremarkable. Upper chest: The lung apices are clear. Review of the MIP images confirms the above findings CTA HEAD FINDINGS Anterior circulation: The bilateral  cavernous ICAs are patent. There is multifocal irregularity and narrowing of the right M1 segment with complete occlusion distally. There is reconstitution of flow within the proximal M2 branches, but there is diminished opacification distally. The left middle cerebral artery and bilateral anterior cerebral arteries are patent. Posterior circulation: The right vertebral artery is slightly diminutive compared to the left, a normal variant. The V4 segments are patent. The basilar artery and bilateral posterior cerebral arteries are patent. Venous sinuses: Patent. Review of the MIP images confirms the above findings IMPRESSION: Occluded distal right M1 segment with diminutive reconstitution of flow of the distal branches. These results were called by telephone at the time of interpretation on  12/05/2020 at 10:20 am to provider Dr. Romilda Garret , who verbally acknowledged these results. Electronically Signed   By: Lesia Hausen MD   On: 12/05/2020 10:30   CT ANGIO NECK CODE STROKE  Result Date: 12/05/2020 CLINICAL DATA:  Left arm and leg ataxia EXAM: CT ANGIOGRAPHY HEAD AND NECK TECHNIQUE: Multidetector CT imaging of the head and neck was performed using the standard protocol during bolus administration of intravenous contrast. Multiplanar CT image reconstructions and MIPs were obtained to evaluate the vascular anatomy. Carotid stenosis measurements (when applicable) are obtained utilizing NASCET criteria, using the distal internal carotid diameter as the denominator. CONTRAST:  80mL OMNIPAQUE IOHEXOL 350 MG/ML SOLN COMPARISON:  Same-day noncontrast CT head, brain MRI 03/03/2020, CT/CTA head 03/02/2020 FINDINGS: CTA NECK FINDINGS Aortic arch: Standard branching. Imaged portion shows no evidence of aneurysm or dissection. No significant stenosis of the major arch vessel origins. Right carotid system: No evidence of dissection, stenosis (50% or greater) or occlusion. Left carotid system: No evidence of dissection, stenosis (50% or greater) or occlusion. Vertebral arteries: Codominant. No evidence of dissection, stenosis (50% or greater) or occlusion. Skeleton: There is no acute osseous abnormality. Other neck: The soft tissues are unremarkable. Upper chest: The lung apices are clear. Review of the MIP images confirms the above findings CTA HEAD FINDINGS Anterior circulation: The bilateral cavernous ICAs are patent. There is multifocal irregularity and narrowing of the right M1 segment with complete occlusion distally. There is reconstitution of flow within the proximal M2 branches, but there is diminished opacification distally. The left middle cerebral artery and bilateral anterior cerebral arteries are patent. Posterior circulation: The right vertebral artery is slightly diminutive compared to the left,  a normal variant. The V4 segments are patent. The basilar artery and bilateral posterior cerebral arteries are patent. Venous sinuses: Patent. Review of the MIP images confirms the above findings IMPRESSION: Occluded distal right M1 segment with diminutive reconstitution of flow of the distal branches. These results were called by telephone at the time of interpretation on 12/05/2020 at 10:20 am to provider Dr. Romilda Garret , who verbally acknowledged these results. Electronically Signed   By: Lesia Hausen MD   On: 12/05/2020 10:30     PHYSICAL EXAM  Temp:  [97.8 F (36.6 C)-98.4 F (36.9 C)] 98.1 F (36.7 C) (08/06 1200) Pulse Rate:  [48-68] 53 (08/06 1200) Resp:  [10-23] 21 (08/06 1200) BP: (107-174)/(74-125) 152/103 (08/06 1200) SpO2:  [85 %-100 %] 100 % (08/06 1200)  General - Well nourished, well developed, in no apparent distress.  Ophthalmologic - fundi not visualized due to noncooperation.  Cardiovascular - Regular rhythm and rate.  Mental Status -  Level of arousal and orientation to time, place, and person were intact. Language including expression, naming, repetition, comprehension was assessed and found intact. Attention span  and concentration were normal. Recent and remote memory were intact. Fund of Knowledge was assessed and was intact.  Cranial Nerves II - XII - II - Visual field intact OU. III, IV, VI - Extraocular movements intact. V - Facial sensation intact bilaterally. VII - Facial movement intact bilaterally. VIII - Hearing & vestibular intact bilaterally. X - Palate elevates symmetrically. XI - Chin turning & shoulder shrug intact bilaterally. XII - Tongue protrusion intact.  Motor Strength - The patient's strength was normal in all extremities and pronator drift was absent.  Bulk was normal and fasciculations were absent.   Motor Tone - Muscle tone was assessed at the neck and appendages and was normal.  Reflexes - The patient's reflexes were symmetrical  in all extremities and he had no pathological reflexes.  Sensory - Light touch, temperature/pinprick were assessed and were symmetrical.    Coordination - The patient had normal movements in the hands and feet with no ataxia or dysmetria.  Tremor was absent.  Gait and Station - deferred.   ASSESSMENT/PLAN Mr. Anthonee Gelin is a 35 y.o. male with history of smoking, stroke in 03/2020 admitted for left UE and LE ataxia. TPA given.  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 Chronic  CT no acute finding CTA head and neck right M1 occlusion with good collateral MRI  no acute infarct 2D Echo  EF 60-65%, no PFO Patient again refused TEE this time He will think about cerebral angiogram which may plan on Monday. LDL 157 HgbA1c 6.1 UDS positive for THC SCDs for VTE prophylaxis No antithrombotic prior to admission, now on aspirin 325 mg daily and clopidogrel 75 mg daily DAPT for 3 months and then ASA alone.  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  CT negative.  Status post tPA.  CTA head and neck showed right M1 occlusion with CTP positive for 34 cc of penumbra.  Repeat CTA 1.5 hours later right M1 reconstituted.  MRI showed small right MCA infarct.  DVT negative.  EF 60 to 65%.  Patient refused TEE and TCD bubble study.  LDL 142, A1c 5.9.  UDS positive for THC.  Hypercoagulable work-up negative.  Discharged with DAPT and Lipitor 40. Patient states that he has relapsed on smoking cigarette, not eating healthy and not compliant with medication.  Hypertension Stable on the high end BP < 180/105 Long term BP goal 1 30-1 50 given right MCA occlusion.  Hyperlipidemia Home meds:  none  LDL 157, goal < 70 Now on Lipitor  80 Continue statin at discharge  Tobacco abuse Current smoker with relapse Smoking cessation counseling provided Pt is willing to quit   Other Stroke Risk Factors unhealthy diet THC abuse, UDS again showed positive for [facility], education for cessation provided.  Other Active Problems   Hospital day # 1  This patient is critically ill due to stroke symptoms, status post tPA, right MCA occlusion and at significant risk of neurological worsening, death form recurrent stroke, hemorrhagic conversion, bleeding from tPA. This patient's care requires constant monitoring of vital signs, hemodynamics, respiratory and cardiac monitoring, review of multiple databases, neurological assessment, discussion with family, other specialists and medical decision making of high complexity. I spent 40 minutes of neurocritical care time in the care of this patient.  Marvel Plan, MD PhD Stroke Neurology 12/06/2020 12:59 PM    To contact Stroke Continuity provider, please refer to [email or web address]. After hours, contact General Neurology

## 2020-12-06 NOTE — Evaluation (Signed)
Occupational Therapy Evaluation Patient Details Name: Curtis Beltran MRN: [id number] DOB: 1985/11/16 Today's Date: 12/06/2020    History of Present Illness Pt is a 35 y/o male admitted 12/05/20 with L sided weakness. Recieved TPA 8/5 1006am.  CT head negative, CT brain perfusion with 44mL area of ischemic penumbra in R MCA territory. MRI pending. 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   Clinical Impression   PTA patient independent and working, not driving. Admitted for above and presenting with problem list below, including L sided sensation impairments.  Patient currently completing transfers and mobility without AD with independence, ADLs with independence. Good recall of BEFAST education. BP slightly elevated during session with RN aware, <180 SBP/ 105-116 DBP; all other VSS.  Based on performance today, no further OT needs identified and OT will sign off.     Follow Up Recommendations  No OT follow up    Equipment Recommendations  None recommended by OT    Recommendations for Other Services       Precautions / Restrictions Precautions Precautions: Other (comment) Precaution Comments: watch BP Restrictions Weight Bearing Restrictions: No      Mobility Bed Mobility Overal bed mobility: Independent                  Transfers Overall transfer level: Independent                    Balance Overall balance assessment: No apparent balance deficits (not formally assessed)                                         ADL either performed or assessed with clinical judgement   ADL Overall ADL's : Independent                                             Vision   Vision Assessment?: No apparent visual deficits Additional Comments: no deficits noted in visual testing     Perception     Praxis      Pertinent Vitals/Pain Pain Assessment: No/denies pain     Hand Dominance Right   Extremity/Trunk Assessment Upper Extremity  Assessment Upper Extremity Assessment: LUE deficits/detail LUE Deficits / Details: pt reports L UE remains tingly, but getting better (80% back); otherwise WFL LUE Sensation: WNL LUE Coordination: WNL   Lower Extremity Assessment Lower Extremity Assessment: Defer to PT evaluation   Cervical / Trunk Assessment Cervical / Trunk Assessment: Normal   Communication Communication Communication: No difficulties   Cognition Arousal/Alertness: Awake/alert Behavior During Therapy: WFL for tasks assessed/performed Overall Cognitive Status: Within Functional Limits for tasks assessed                                     General Comments       Exercises     Shoulder Instructions      Home Living Family/patient expects to be discharged to:: Private residence Living Arrangements: Spouse/significant other Available Help at Discharge: Family;Available 24 hours/day Type of Home: House Home Access: Stairs to enter [employer] of Steps: 2 Entrance Stairs-Rails: Left Home Layout: One level     Bathroom Shower/Tub: Chief Strategy Officer: Standard  Additional Comments: alone, mother of his child will be available working from home      Prior Functioning/Environment Level of Independence: Independent        Comments: works in Hydrologist, 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        OT Problem List: Impaired sensation      OT Treatment/Interventions:      OT Goals(Current goals can be found in the care plan section) Acute Rehab OT Goals Patient Stated Goal: home today OT Goal Formulation: With patient Time For Goal Achievement: 12/20/20 Potential to Achieve Goals: Good  OT Frequency:     Barriers to D/C:            Co-evaluation              AM-PAC OT "6 Clicks" Daily Activity     Outcome Measure Help from another person eating meals?: None Help from another person taking care of personal grooming?: None Help from another person  toileting, which includes using toliet, bedpan, or urinal?: None Help from another person bathing (including washing, rinsing, drying)?: None Help from another person to put on and taking off regular upper body clothing?: None Help from another person to put on and taking off regular lower body clothing?: None 6 Click Score: 24   End of Session Nurse Communication: Mobility status;Other (comment) (BP)  Activity Tolerance: Patient tolerated treatment well Patient left: Other (comment) (with PT)  OT Visit Diagnosis: Other symptoms and signs involving the nervous system (R29.898)                Time: 1041-1100 OT Time Calculation (min): 19 min Charges:  OT General Charges $OT Visit: 1 Visit OT Evaluation $OT Eval Low Complexity: 1 Low  Barry Brunner, OT Acute Rehabilitation Services Pager [phone] Office [phone]   Chancy Milroy 12/06/2020, 11:11 AM

## 2020-12-06 NOTE — Evaluation (Signed)
Physical Therapy Evaluation Patient Details Name: Curtis Beltran MRN: [id number] DOB: [birth year] Today's Date: 12/06/2020   History of Present Illness  Pt is a 35 y/o male admitted 12/05/20 with L sided weakness. Recieved TPA 8/5 1006am.  CT head negative, CT brain perfusion with 6mL area of ischemic penumbra in R MCA territory. MRI pending. 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   Clinical Impression  Pt in bed upon arrival of PT, agreeable to evaluation at this time. Prior to admission the pt was completely independent with all mobility, working manual labor. The pt now presents with minor limitations in functional mobility and activity tolerance due to above dx, but is safe to return home with family for assist and supervision once medically cleared. The pt was able to safely complete all transfers, hallway ambulation, and stairs without assist, UE support, or evidence of instability. The pt's mobility was most limited by BP elevation, but the pt reported no change in sx with elevation (RN aware). The pt has no further acute PT needs, all education complete, thank you for the consult.     Follow Up Recommendations No PT follow up;Supervision - Intermittent    Equipment Recommendations  None recommended by PT    Recommendations for Other Services       Precautions / Restrictions Precautions Precautions: Other (comment) Precaution Comments: watch BP Restrictions Weight Bearing Restrictions: No      Mobility  Bed Mobility Overal bed mobility: Independent                  Transfers Overall transfer level: Independent                  Ambulation/Gait Ambulation/Gait assistance: Independent Gait Distance (Feet): 150 Feet (x2) Assistive device: None Gait Pattern/deviations: WFL(Within Functional Limits) Gait velocity: 0.8 m/s Gait velocity interpretation: 1.31 - 2.62 ft/sec, indicative of limited community ambulator General Gait Details: pt with good, steady gait. reports he is  at baseline, walking faster than he normally does  Stairs Stairs: Yes Stairs assistance: Independent Stair Management: No rails;Alternating pattern;Forwards Number of Stairs: 2    Wheelchair Mobility    Modified Rankin (Stroke Patients Only) Modified Rankin (Stroke Patients Only) Pre-Morbid Rankin Score: No symptoms Modified Rankin: Slight disability     Balance Overall balance assessment: No apparent balance deficits (not formally assessed)                                           Pertinent Vitals/Pain Pain Assessment: No/denies pain    Home Living Family/patient expects to be discharged to:: Private residence Living Arrangements: Spouse/significant other Available Help at Discharge: Family;Available 24 hours/day Type of Home: House Home Access: Stairs to enter Entrance Stairs-Rails: Left Entrance Stairs-Number of Steps: 2 Home Layout: One level   Additional Comments: alone, mother of his child will be available working from home    Prior Function Level of Independence: Independent         Comments: works in Hydrologist, 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 5/5 strength LLE Sensation:  (tingling)    Cervical / Trunk Assessment Cervical / Trunk Assessment: Normal  Communication   Communication: No difficulties  Cognition Arousal/Alertness: Awake/alert Behavior During Therapy: WFL for tasks assessed/performed Overall Cognitive Status: Within Functional Limits for tasks  assessed                                        General Comments General comments (skin integrity, edema, etc.): BP elevated through session, RN aware    Exercises     Assessment/Plan    PT Assessment Patent does not need any further PT services  PT Problem List         PT Treatment Interventions      PT Goals (Current goals can be found in the Care Plan section)  Acute Rehab PT Goals Patient Stated Goal: home today PT Goal Formulation: With patient Time For Goal Achievement: 12/13/20 Potential to Achieve Goals: Good     AM-PAC PT "6 Clicks" Mobility  Outcome Measure Help needed turning from your back to your side while in a flat bed without using bedrails?: None Help needed moving from lying on your back to sitting on the side of a flat bed without using bedrails?: None Help needed moving to and from a bed to a chair (including a wheelchair)?: None Help needed standing up from a chair using your arms (e.g., wheelchair or bedside chair)?: None Help needed to walk in hospital room?: None Help needed climbing 3-5 steps with a railing? : None 6 Click Score: 24    End of Session Equipment Utilized During Treatment: Gait belt Activity Tolerance: Patient tolerated treatment well Patient left: in chair;with call bell/phone within reach Nurse Communication: Mobility status (BP) PT Visit Diagnosis: Other abnormalities of gait and mobility (R26.89)    Time: 1100-1118 PT Time Calculation (min) (ACUTE ONLY): 18 min   Charges:   PT Evaluation $PT Eval Low Complexity: 1 Low          Windell Musson [place] Hun, PT, DPT   Acute Rehabilitation Department Pager #: [phone]  Gaetana Michaelis 12/06/2020, 11:25 AM

## 2020-12-06 NOTE — Progress Notes (Signed)
Pt expressed he does not want to go through w/ cerebral angiogram.  Stated he will stay on top of medications at home.  Dr. Roda Shutters made aware.

## 2021-02-05 DIAGNOSIS — Z0271 Encounter for disability determination: Secondary | ICD-10-CM

## 2021-03-12 ENCOUNTER — Other Ambulatory Visit: Payer: Self-pay

## 2021-03-16 ENCOUNTER — Other Ambulatory Visit (INDEPENDENT_AMBULATORY_CARE_PROVIDER_SITE_OTHER): Payer: Self-pay

## 2021-03-17 ENCOUNTER — Other Ambulatory Visit: Payer: Self-pay

## 2021-03-17 NOTE — Patient Outreach (Signed)
[facility]) Care Management  03/17/2021  Parke Jandreau 10-25-1985 [id number]   First telephone outreach attempt to obtain mRS. No answer. Left message for returned call.  [name] [facility] Management Assistant [phone]

## 2021-03-20 ENCOUNTER — Other Ambulatory Visit: Payer: Self-pay

## 2021-03-20 NOTE — Patient Outreach (Signed)
[facility]) Care Management  03/20/2021  Curtis Beltran Feb 24, 1986 [id number]   mRs could not be obtained because patient is at work and refused. mRs=7    [name] Care Management Assistant [phone]

## 2021-05-20 ENCOUNTER — Encounter (INDEPENDENT_AMBULATORY_CARE_PROVIDER_SITE_OTHER): Payer: Self-pay

## 2021-05-20 ENCOUNTER — Ambulatory Visit (INDEPENDENT_AMBULATORY_CARE_PROVIDER_SITE_OTHER): Payer: Self-pay | Admitting: Primary Care

## 2021-07-13 ENCOUNTER — Other Ambulatory Visit: Payer: Self-pay

## 2021-07-13 ENCOUNTER — Encounter (INDEPENDENT_AMBULATORY_CARE_PROVIDER_SITE_OTHER): Payer: Self-pay | Admitting: Primary Care

## 2021-07-13 ENCOUNTER — Ambulatory Visit (INDEPENDENT_AMBULATORY_CARE_PROVIDER_SITE_OTHER): Payer: 59 | Admitting: Primary Care

## 2021-07-13 VITALS — BP 141/89 | HR 65 | Temp 97.4°F | Ht 70.5 in | Wt 195.2 lb

## 2021-07-13 DIAGNOSIS — Z131 Encounter for screening for diabetes mellitus: Secondary | ICD-10-CM | POA: Diagnosis not present

## 2021-07-13 DIAGNOSIS — I10 Essential (primary) hypertension: Secondary | ICD-10-CM | POA: Diagnosis not present

## 2021-07-13 DIAGNOSIS — E785 Hyperlipidemia, unspecified: Secondary | ICD-10-CM | POA: Diagnosis not present

## 2021-07-13 DIAGNOSIS — I63411 Cerebral infarction due to embolism of right middle cerebral artery: Secondary | ICD-10-CM

## 2021-07-13 DIAGNOSIS — K219 Gastro-esophageal reflux disease without esophagitis: Secondary | ICD-10-CM

## 2021-07-13 DIAGNOSIS — Z7689 Persons encountering health services in other specified circumstances: Secondary | ICD-10-CM

## 2021-07-13 LAB — POCT GLYCOSYLATED HEMOGLOBIN (HGB A1C): Hemoglobin A1C: 6 % — AB (ref 4.0–5.6)

## 2021-07-13 MED ORDER — CLOPIDOGREL BISULFATE 75 MG PO TABS: 75.0000 mg | ORAL_TABLET | Freq: Every day | ORAL | 1 refills | Status: DC

## 2021-07-13 MED ORDER — HYDROCHLOROTHIAZIDE 25 MG PO TABS: 25.0000 mg | ORAL_TABLET | Freq: Every day | ORAL | 1 refills | Status: DC

## 2021-07-13 MED ORDER — AMLODIPINE BESYLATE 10 MG PO TABS: 10.0000 mg | ORAL_TABLET | Freq: Every day | ORAL | 1 refills | Status: AC

## 2021-07-13 NOTE — Progress Notes (Unsigned)
° °  Subjective:  Patient ID: Curtis Beltran, male    DOB: [birth year]  Age: 36 y.o. MRN: [id number]  CC: New Patient (Initial Visit) (Acid reflux/medication refills)   HPI Curtis Beltran presents for ***  Outpatient Medications Prior to Visit  Medication Sig Dispense Refill   aspirin EC 325 MG EC tablet Take 1 tablet (325 mg total) by mouth daily. (Patient not taking: Reported on 07/13/2021) 90 tablet 3   atorvastatin (LIPITOR) 80 MG tablet Take 1 tablet (80 mg total) by mouth at bedtime. (Patient not taking: Reported on 07/13/2021) 90 tablet 3   clopidogrel (PLAVIX) 75 MG tablet Take 1 tablet (75 mg total) by mouth daily. (Patient not taking: Reported on 07/13/2021) 90 tablet 0   amLODipine (NORVASC) 10 MG tablet Take 1 tablet (10 mg total) by mouth daily. 30 tablet 2   Artificial Tear Ointment (DRY EYES OP) Place 1 drop into both eyes daily as needed (dry eyes).     No facility-administered medications prior to visit.    ROS Review of Systems  Objective:  BP (!) 141/89 (BP Location: Right Arm, Patient Position: Sitting, Cuff Size: Normal)    Pulse 65    Temp (!) 97.4 F (36.3 C) (Oral)    Ht 5' 10.5" (1.791 m)    Wt 195 lb 3.2 oz (88.5 kg)    SpO2 97%    BMI 27.61 kg/m   BP/Weight 07/13/2021 [id number]  Systolic BP Q000111Q 0000000 -  Diastolic BP 89 [id number] -  Wt. (Lbs) 195.2 - 193.12  BMI 27.61 - 28.52    {CHL AMB CHW Note lists:[id number]}  Physical Exam   Assessment & Plan:   There are no diagnoses linked to this encounter.  No orders of the defined types were placed in this encounter.   Follow-up: No follow-ups on file.   Kerin Perna NP

## 2021-07-20 ENCOUNTER — Telehealth: Payer: Self-pay

## 2021-07-20 ENCOUNTER — Telehealth (INDEPENDENT_AMBULATORY_CARE_PROVIDER_SITE_OTHER): Payer: Self-pay | Admitting: Primary Care

## 2021-07-20 ENCOUNTER — Other Ambulatory Visit (INDEPENDENT_AMBULATORY_CARE_PROVIDER_SITE_OTHER): Payer: Self-pay | Admitting: Primary Care

## 2021-07-20 DIAGNOSIS — K219 Gastro-esophageal reflux disease without esophagitis: Secondary | ICD-10-CM

## 2021-07-20 NOTE — Telephone Encounter (Signed)
Pt called back and I could not get through to the office.  I told him about th mobile bus and he did not want to do that.  He said he wants a referral to GI and he has a GI doctor that he wants to see. ?

## 2021-07-20 NOTE — Telephone Encounter (Signed)
Copied from CRM [id number]. Topic: General - Other ?>> Jul 20, 2021  2:25 PM [place], [place] wrote: ?Reason for CRM: pt called in states needs a a referral for stomach issues, feels like he can't burp. His appt isnt until 04/13. ?

## 2021-07-20 NOTE — Telephone Encounter (Signed)
Patient is requesting a referral. He has also asked via mychart messages with PCP.  ?

## 2021-07-21 ENCOUNTER — Encounter (HOSPITAL_COMMUNITY): Payer: Self-pay | Admitting: Emergency Medicine

## 2021-07-21 ENCOUNTER — Ambulatory Visit (HOSPITAL_COMMUNITY)
Admission: EM | Admit: 2021-07-21 | Discharge: 2021-07-21 | Disposition: A | Discharge status: Home / Self Care | Payer: 59 | Attending: Nurse Practitioner | Admitting: Nurse Practitioner

## 2021-07-21 DIAGNOSIS — R1013 Epigastric pain: Secondary | ICD-10-CM

## 2021-07-21 HISTORY — DX: Cerebral infarction, unspecified: I63.9

## 2021-07-21 MED ORDER — PANTOPRAZOLE SODIUM 40 MG PO TBEC: 40.0000 mg | DELAYED_RELEASE_TABLET | Freq: Every day | ORAL | 0 refills | Status: DC

## 2021-07-21 NOTE — Discharge Instructions (Addendum)
-   Please start pantoprazole 40 mg daily on an empty stomach ?-If this is not helping with your pain after 1 to 2 weeks, please make an appointment with a gastroenterologist; the number is provided below ?

## 2021-07-21 NOTE — ED Triage Notes (Signed)
Pt reports that for 2 months reports pains and feels something stuck and hard for him to burp in epigastric area. Reports at night will be worse esp after eating certain things.  Took Tums and another OTC acid reducer without relief.  ?Uncertain if result from his two strokes.  ?

## 2021-07-21 NOTE — ED Provider Notes (Signed)
?[facility] ? ? ? ?CSN: [id number] ?Arrival date & time: 07/21/21  1345 ? ? ?  ? ?History   ?Chief Complaint ?Chief Complaint  ?Patient presents with  ? Chest Pain  ? Abdominal Pain  ? ? ?HPI ?Curtis Beltran is a 36 y.o. male.  ? ?Patient reports epigastric pain has been going on for about 1 month.  He reports the pain is worse when laying down, and first in the morning.  He reports he occasionally burps up acid.  He he reports occasional dysphagia, however denies any hematic emesis or coughing up blood.  He denies any blood in stool.  He has taken Tums, over-the-counter omeprazole and Pepcid, and Pepto-Bismol without relief of symptoms.  He thinks the omeprazole has been the most effective, however he is still having symptoms. ? ? ? ?Past Medical History:  ?Diagnosis Date  ? Stroke [facility](HCC)   ? ? ?Patient Active Problem List  ? Diagnosis Date Noted  ? Acute ischemic right MCA stroke (HCC) 12/05/2020  ? Acute right MCA stroke (HCC) 12/05/2020  ? Received intravenous tissue plasminogen activator (tPA) in emergency department   ? Essential hypertension 03/04/2020  ? Hyperlipidemia LDL goal <70 03/04/2020  ? Tobacco abuse 03/04/2020  ? Marijuana use 03/04/2020  ? Hypokalemia 03/04/2020  ? Bradycardia 03/04/2020  ? 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 ? ?Past Surgical History:  ?Procedure Laterality Date  ? FRACTURE SURGERY    ? ? ? ? ? ?Home Medications   ? ?Prior to Admission medications   ?Medication Sig Start Date End Date Taking? Authorizing Provider  ?pantoprazole (PROTONIX) 40 MG tablet Take 1 tablet (40 mg total) by mouth daily. 07/21/21  Yes Valentino NoseMartinez, Maclane Holloran A, NP  ?amLODipine (NORVASC) 10 MG tablet Take 1 tablet (10 mg total) by mouth daily. 07/13/21   Grayce SessionsEdwards, Michelle P, NP  ?aspirin EC 325 MG EC tablet Take 1 tablet (325 mg total) by mouth daily. ?Patient not taking: Reported on 07/13/2021 12/07/20   Marvel PlanXu, Jindong, MD  ?atorvastatin (LIPITOR) 80 MG  tablet Take 1 tablet (80 mg total) by mouth at bedtime. ?Patient not taking: Reported on 07/13/2021 12/06/20   Marvel PlanXu, Jindong, MD  ?clopidogrel (PLAVIX) 75 MG tablet Take 1 tablet (75 mg total) by mouth daily. 07/13/21   Grayce SessionsEdwards, Michelle P, NP  ?hydrochlorothiazide (HYDRODIURIL) 25 MG tablet Take 1 tablet (25 mg total) by mouth daily. 07/13/21   Grayce SessionsEdwards, Michelle P, NP  ? ? ?Family History ?Family History  ?Problem Relation Age of Onset  ? Healthy Mother   ? Hypertension Maternal Aunt   ? ? ?Social History ?Social History  ? ?Tobacco Use  ? Smoking status: Former  ?  Types: Cigarettes, Cigars  ? Smokeless tobacco: Never  ?Vaping Use  ? Vaping Use: Some days  ?Substance Use Topics  ? Alcohol use: Never  ? Drug use: Yes  ?  Types: Marijuana  ? ? ? ?Allergies   ?Patient has no known allergies. ? ? ?Review of Systems ?Review of Systems ?Per HPI ? ?Physical Exam ?Triage Vital Signs ?ED Triage Vitals  ?Enc Vitals Group  ?   BP 07/21/21 1456 (!) 125/91  ?   Pulse Rate 07/21/21 1456 74  ?   Resp 07/21/21 1456 15  ?   Temp 07/21/21 1456 97.6 ?F (36.4 ?C)  ?   Temp Source 07/21/21 1456 Oral  ?   SpO2 07/21/21 1456 96 %  ?  Weight --   ?   Height --   ?   Head Circumference --   ?   Peak Flow --   ?   Pain Score 07/21/21 1455 0  ?   Pain Loc --   ?   Pain Edu? --   ?   Excl. in GC? --   ? ?No data found. ? ?Updated Vital Signs ?BP (!) 125/91 (BP Location: Left Arm)   Pulse 74   Temp 97.6 ?F (36.4 ?C) (Oral)   Resp 15   SpO2 96%  ? ?Visual Acuity ?Right Eye Distance:   ?Left Eye Distance:   ?Bilateral Distance:   ? ?Right Eye Near:   ?Left Eye Near:    ?Bilateral Near:    ? ?Physical Exam ?Vitals and nursing note reviewed.  ?Constitutional:   ?   General: He is not in acute distress. ?   Appearance: He is well-developed. He is not toxic-appearing.  ?HENT:  ?   Head: Normocephalic and atraumatic.  ?   Mouth/Throat:  ?   Mouth: Mucous membranes are moist.  ?   Pharynx: No pharyngeal swelling.  ?Eyes:  ?   General: No scleral  icterus. ?Cardiovascular:  ?   Rate and Rhythm: Normal rate and regular rhythm.  ?Pulmonary:  ?   Effort: Pulmonary effort is normal. No respiratory distress.  ?   Breath sounds: Normal breath sounds. No wheezing, rhonchi or rales.  ?Abdominal:  ?   General: Abdomen is flat. Bowel sounds are normal.  ?   Palpations: Abdomen is soft.  ?   Tenderness: There is abdominal tenderness in the epigastric area.  ?   Hernia: No hernia is present.  ?Skin: ?   General: Skin is warm and dry.  ?   Capillary Refill: Capillary refill takes less than 2 seconds.  ?   Coloration: Skin is not cyanotic, jaundiced or pale.  ?Neurological:  ?   Mental Status: He is alert and oriented to person, place, and time.  ?Psychiatric:     ?   Mood and Affect: Mood normal.     ?   Behavior: Behavior normal.  ? ? ? ?UC Treatments / Results  ?Labs ?(all labs ordered are listed, but only abnormal results are displayed) ?Labs Reviewed - No data to display ? ?EKG ? ? ?Radiology ?No results found. ? ?Procedures ?Procedures (including critical care time) ? ?Medications Ordered in UC ?Medications - No data to display ? ?Initial Impression / Assessment and Plan / UC Course  ?I have reviewed the triage vital signs and the nursing notes. ? ?Pertinent labs & imaging results that were available during my care of the patient were reviewed by me and considered in my medical decision making (see chart for details). ? ?  ?We will treat epigastric pain with pantoprazole 40 mg daily for 6 weeks.  I encouraged follow-up with a gastroenterologist for further evaluation and management, especially if the pain does not improve with this medication.  Discussed foods low in acid and handout provided.  If symptoms worsen, seek care urgently.  ?Final Clinical Impressions(s) / UC Diagnoses  ? ?Final diagnoses:  ?Abdominal pain, epigastric  ? ? ? ?Discharge Instructions   ? ?  ?- Please start pantoprazole 40 mg daily on an empty stomach ?-If this is not helping with your pain  after 1 to 2 weeks, please make an appointment with a gastroenterologist; the number is provided below ? ? ? ? ?ED Prescriptions   ? ?  Medication Sig Dispense Auth. Provider  ? pantoprazole (PROTONIX) 40 MG tablet Take 1 tablet (40 mg total) by mouth daily. 60 tablet Valentino Nose, NP  ? ?  ? ?PDMP not reviewed this encounter. ?  ?Valentino Nose, NP ?07/21/21 1519 ? ?

## 2021-08-05 ENCOUNTER — Telehealth (INDEPENDENT_AMBULATORY_CARE_PROVIDER_SITE_OTHER): Payer: Self-pay

## 2021-08-05 ENCOUNTER — Other Ambulatory Visit (INDEPENDENT_AMBULATORY_CARE_PROVIDER_SITE_OTHER): Payer: Self-pay | Admitting: Primary Care

## 2021-08-05 DIAGNOSIS — Z1211 Encounter for screening for malignant neoplasm of colon: Secondary | ICD-10-CM

## 2021-08-05 NOTE — Telephone Encounter (Signed)
Copied from CRM [id number]. Topic: Referral - Status ?>> Aug 05, 2021  9:44 AM Gaetana Michaelis A wrote: ?Reason for CRM: Misty Stanley with [facility] has called to share that Dr Elnoria Howard will be unable to see the patient for their GI concerns  ? ?The reason for the decline of the referral was unknown at the time of call  ? ?Please contact further if needed ?

## 2021-08-13 ENCOUNTER — Ambulatory Visit (INDEPENDENT_AMBULATORY_CARE_PROVIDER_SITE_OTHER): Payer: 59 | Admitting: Primary Care

## 2021-09-07 NOTE — Progress Notes (Deleted)
New Patient Office Visit  Subjective    Patient ID: Curtis Beltran, male    DOB: [birth year]  Age: 36 y.o. MRN: [id number]  CC: No chief complaint on file.   HPI Curtis Beltran presents to establish care ***  Outpatient Encounter Medications as of 09/10/2021  Medication Sig   amLODipine (NORVASC) 10 MG tablet Take 1 tablet (10 mg total) by mouth daily.   aspirin EC 325 MG EC tablet Take 1 tablet (325 mg total) by mouth daily. (Patient not taking: Reported on 07/13/2021)   atorvastatin (LIPITOR) 80 MG tablet Take 1 tablet (80 mg total) by mouth at bedtime. (Patient not taking: Reported on 07/13/2021)   clopidogrel (PLAVIX) 75 MG tablet Take 1 tablet (75 mg total) by mouth daily.   hydrochlorothiazide (HYDRODIURIL) 25 MG tablet Take 1 tablet (25 mg total) by mouth daily.   pantoprazole (PROTONIX) 40 MG tablet Take 1 tablet (40 mg total) by mouth daily.   No facility-administered encounter medications on file as of 09/10/2021.    Past Medical History:  Diagnosis Date   Stroke [facility])     Past Surgical History:  Procedure Laterality Date   FRACTURE SURGERY      Family History  Problem Relation Age of Onset   Healthy Mother    Hypertension Maternal Aunt     Social History   Socioeconomic History   Marital status: Single    Spouse name: Not on file   Number of children: Not on file   Years of education: Not on file   Highest education level: Not on file  Occupational History   Not on file  Tobacco Use   Smoking status: Former    Types: Cigarettes, Cigars   Smokeless tobacco: Never  Vaping Use   Vaping Use: Some days  Substance and Sexual Activity   Alcohol use: Never   Drug use: Yes    Types: Marijuana   Sexual activity: Not on file  Other Topics Concern   Not on file  Social History Narrative   Not on file   Social Determinants of Health   Financial Resource Strain: Not on file  Food Insecurity: Not on file  Transportation Needs: Not on file  Physical  Activity: Not on file  Stress: Not on file  Social Connections: Not on file  Intimate Partner Violence: Not on file    ROS      Objective    There were no vitals taken for this visit.  Physical Exam  {Labs (Optional):23779}    Assessment & Plan:   Problem List Items Addressed This Visit   None   No follow-ups on file.   Curtis Dick, DO

## 2021-09-10 ENCOUNTER — Ambulatory Visit: Payer: 59 | Admitting: Family Medicine

## 2021-09-18 NOTE — Progress Notes (Signed)
Subjective:    Patient ID: Curtis Beltran, male    DOB: Jan 16, 1986, 36 y.o.   MRN: [id number]   CC: Establish care  HPI:  Curtis Beltran is a very pleasant 36 y.o. male who presents today to establish care.  Initial concerns: Has concerns regarding acid reflux. Feels like when food goes down, "it takes a long time for me to digest". No nausea. No bad taste in mouth. Vomiting "small particles". Feels like food is coming up.  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 Recommended to continue ASA. BP goal 130-150 systolic given MCA occlusion, should avoid hypotension.  Also hx of tobacco use disorder, marijuana use disorder, bradycardia.  Past surgical history: Broken finger right hand at age 58   Current medications: Amlodipine 10 mg, ASA 81 mg, HCTZ 25 mg  Family history: Mother prediabetic.   Social history: Stopped smoking tobacco after second CVA, around Oct 2022 (was smoking Black and milds 1x/day for 18 years). Marijuana use- "here and there", 2-3 times a day. No alcohol.  ROS: pertinent noted in the HPI   Objective:  BP 129/74   Pulse 91   Ht 5\' 9"  (1.753 m)   Wt 181 lb 12.8 oz (82.5 kg)   SpO2 100%   BMI 26.85 kg/m    Vitals and nursing note reviewed  General: NAD, pleasant, able to participate in exam Cardiac: RRR, S1 S2 present. normal heart sounds, no murmurs. Respiratory: CTAB, normal effort, No wheezes, rales or rhonchi Abdomen: Bowel sounds present, tender to epigastric region without R/G, non-distended Extremities: no edema or cyanosis. Skin: warm and dry, no rashes noted Neuro: alert, no obvious focal deficits Psych: Normal affect and mood   Assessment & Plan:   1. Essential hypertension Per Neurology note, goal SBP 130-150 and avoid hypotension due to hx of MCA occlusion.  BP today normotensive. Has been doubling up on HCTZ for weight loss- we discussed the risks of doing so. He is due for annual BMP today for his HTN. -Basic Metabolic Panel -Continue Amlodipine 10 mg -STOP HCTZ  2. Hyperlipidemia LDL goal <70 Unfortunately reports he has not been taking his statin.  I suspect that his cholesterol will be elevated.  I did refill his atorvastatin today.  We discussed importance of good cholesterol control due to his history of CVA. - Lipid Panel -Atorvastatin 80 mg   3. Prediabetes Last A1c in March was 6. Exercise and diet recommendations provided on AVS.   5. History of CVA (cerebrovascular accident) Refilled statin.  He is on 81 mg of aspirin daily.  No longer needs Plavix.  Congratulated him on smoking cessation.  Encouraged marijuana cessation.  6. Marijuana use We discussed that marijuana use can lead to abdominal issues- more specifically nausea and vomiting. Recommended cessation.   7. Epigastric pain Sensation of regurgitation without halitosis or dysphagia. No frequent vomiting but does feel nauseous on occasion. He is concerned about possible hernia. Previously on PPI. I have switched him to an H2 until additional work up is done.  - Ambulatory referral to Gastroenterology -Famotidine 20 mg BID    Sabino Dick, DO Family Medicine Resident

## 2021-09-25 ENCOUNTER — Ambulatory Visit (INDEPENDENT_AMBULATORY_CARE_PROVIDER_SITE_OTHER): Payer: BLUE CROSS/BLUE SHIELD | Admitting: Family Medicine

## 2021-09-25 ENCOUNTER — Encounter: Payer: Self-pay | Admitting: Family Medicine

## 2021-09-25 ENCOUNTER — Other Ambulatory Visit: Payer: Self-pay

## 2021-09-25 VITALS — BP 129/74 | HR 91 | Ht 69.0 in | Wt 181.8 lb

## 2021-09-25 DIAGNOSIS — E785 Hyperlipidemia, unspecified: Secondary | ICD-10-CM | POA: Diagnosis not present

## 2021-09-25 DIAGNOSIS — Z8673 Personal history of transient ischemic attack (TIA), and cerebral infarction without residual deficits: Secondary | ICD-10-CM

## 2021-09-25 DIAGNOSIS — I10 Essential (primary) hypertension: Secondary | ICD-10-CM

## 2021-09-25 DIAGNOSIS — Z7689 Persons encountering health services in other specified circumstances: Secondary | ICD-10-CM

## 2021-09-25 DIAGNOSIS — Z720 Tobacco use: Secondary | ICD-10-CM

## 2021-09-25 DIAGNOSIS — F1290 Cannabis use, unspecified, uncomplicated: Secondary | ICD-10-CM

## 2021-09-25 DIAGNOSIS — R1013 Epigastric pain: Secondary | ICD-10-CM

## 2021-09-25 DIAGNOSIS — R7303 Prediabetes: Secondary | ICD-10-CM

## 2021-09-25 MED ORDER — ATORVASTATIN CALCIUM 80 MG PO TABS: 80.0000 mg | ORAL_TABLET | Freq: Every day | ORAL | 3 refills | Status: AC

## 2021-09-25 MED ORDER — FAMOTIDINE 20 MG PO TABS: 20.0000 mg | ORAL_TABLET | Freq: Two times a day (BID) | ORAL | 1 refills | Status: AC

## 2021-09-25 NOTE — Patient Instructions (Signed)
It was wonderful to see you today.  Please bring ALL of your medications with you to every visit.   Today we talked about:  We are doing lab work today to check your cholesterol, screening for diabetes, and checking your electrolytes and kidney function. I will send you a MyChart message if you have MyChart. Otherwise, I will give you a call for abnormal results or send a letter if everything returned back normal. If you don't hear from me in 2 weeks, please call the office.    Take 81 mg of aspirin daily due to your history of stroke.  We talked about the importance of stopping smoking to help prevent any additional strokes work heart complications.  We are happy to assist you through this process.  You can see below for some options to help.  I have refilled your cholesterol medication.   Today at your annual preventive visit we talked about the following measures: I recommend 150 minutes of exercise per week-try 30 minutes 5 days per week We discussed reducing sugary beverages (like soda and juice) and increasing leafy greens and whole fruits.  We discussed avoiding tobacco and alcohol.  I recommend avoiding illicit substances.  Your blood pressure is at goal today.   Thank you for choosing [facility].   Please call [phone] with any questions about today's appointment.  Please be sure to schedule follow up at the front  desk before you leave today.   Sabino Dick, DO PGY-2 Family Medicine

## 2021-09-26 LAB — LIPID PANEL
Chol/HDL Ratio: 6.5 ratio — ABNORMAL HIGH (ref 0.0–5.0)
Cholesterol, Total: 259 mg/dL — ABNORMAL HIGH (ref 100–199)
HDL: 40 mg/dL (ref >39)
LDL Chol Calc (NIH): 150 mg/dL — ABNORMAL HIGH (ref 0–99)
Triglycerides: 371 mg/dL — ABNORMAL HIGH (ref 0–149)
VLDL Cholesterol Cal: 69 mg/dL — ABNORMAL HIGH (ref 5–40)

## 2021-09-26 LAB — BASIC METABOLIC PANEL
BUN/Creatinine Ratio: 16 (ref 9–20)
BUN: 13 mg/dL (ref 6–20)
CO2: 23 mmol/L (ref 20–29)
Calcium: 9.7 mg/dL (ref 8.7–10.2)
Chloride: 100 mmol/L (ref 96–106)
Creatinine, Ser: 0.8 mg/dL (ref 0.76–1.27)
Glucose: 67 mg/dL — ABNORMAL LOW (ref 70–99)
Potassium: 4 mmol/L (ref 3.5–5.2)
Sodium: 142 mmol/L (ref 134–144)
eGFR: 120 mL/min/{1.73_m2} (ref >59)

## 2021-10-06 ENCOUNTER — Encounter: Payer: Self-pay | Admitting: *Deleted

## 2021-11-21 IMAGING — CT CT ANGIO HEAD-NECK (W OR W/O PERF)
1 series · 1 of 1 positions shown · IV contrast (omnipaque)
Comparison: Same-day noncontrast CT head, brain MRI 03/03/2020,
CT/CTA head 03/02/2020

CLINICAL DATA: Left arm and leg ataxia



[Series 1: topogram 0.6 t20f · sagittal · 1.00mm/px · 1 of 1 slices shown]
[im 1/1]
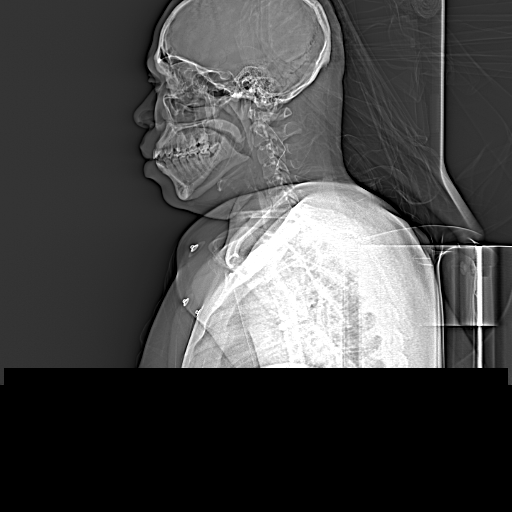

[1 of 1 positions shown; findings below may reference images not displayed]

FINDINGS: CTA NECK FINDINGS

Aortic arch: Standard branching. Imaged portion shows no evidence of
aneurysm or dissection. No significant stenosis of the major arch
vessel origins.

Right carotid system: No evidence of dissection, stenosis (50% or
greater) or occlusion.

Left carotid system: No evidence of dissection, stenosis (50% or
greater) or occlusion.

Vertebral arteries: Codominant. No evidence of dissection, stenosis
(50% or greater) or occlusion.

Skeleton: There is no acute osseous abnormality.

Other neck: The soft tissues are unremarkable.

Upper chest: The lung apices are clear.

Review of the MIP images confirms the above findings

CTA HEAD FINDINGS

Anterior circulation:

The bilateral cavernous ICAs are patent.

There is multifocal irregularity and narrowing of the right M1
segment with complete occlusion distally. There is reconstitution of
flow within the proximal M2 branches, but there is diminished
opacification distally.

The left middle cerebral artery and bilateral anterior cerebral
arteries are patent.

Posterior circulation: The right vertebral artery is slightly
diminutive compared to the left, a normal variant. The V4 segments
are patent. The basilar artery and bilateral posterior cerebral
arteries are patent.

Venous sinuses: Patent.

Review of the MIP images confirms the above findings
IMPRESSION: Occluded distal right M1 segment with diminutive reconstitution of
flow of the distal branches.

These results were called by telephone at the time of interpretation
on 12/05/2020 at [DATE] to provider Dr. Diogenes Duty , who verbally
acknowledged these results.

## 2021-11-21 IMAGING — CT CT HEAD W/O CM
4 series · 15 of 47 positions shown, 17 images · non-contrast
Comparison: None.

CLINICAL DATA: Stroke follow-up

EXAM:
CT HEAD WITHOUT CONTRAST
TECHNIQUE: Contiguous axial images were obtained from the base of the skull
through the vertex without intravenous contrast.

[Series 3: head without · axial · non-contrast · 0.42mm/px · z∈[-114,+6]mm · 7 of 33 slices shown, 9 images]
[im 5/33  brain]
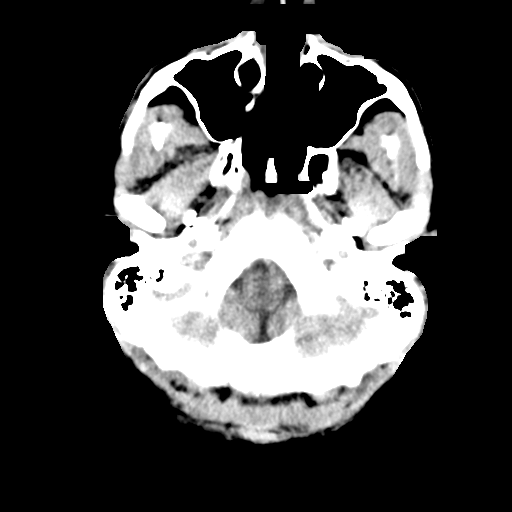
[im 5/33  bone]
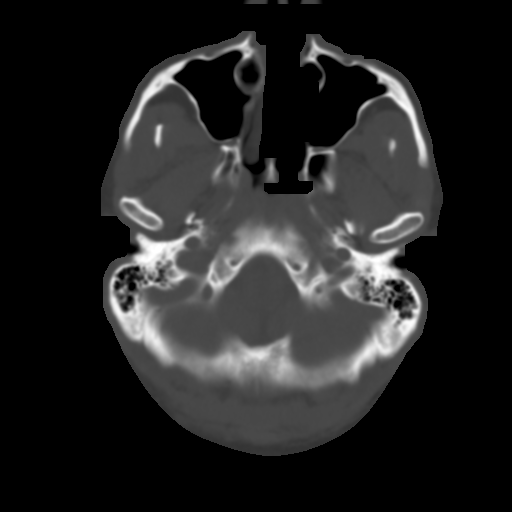
[im 9/33  brain]
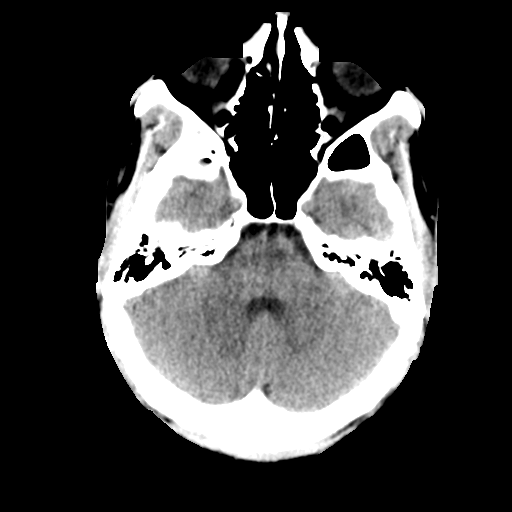
[im 13/33  brain]
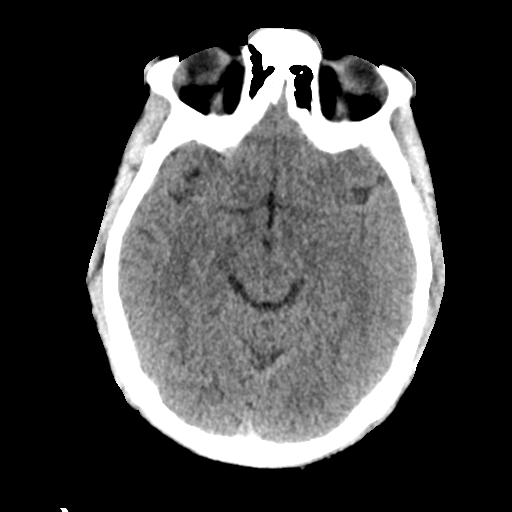
[im 17/33  brain]
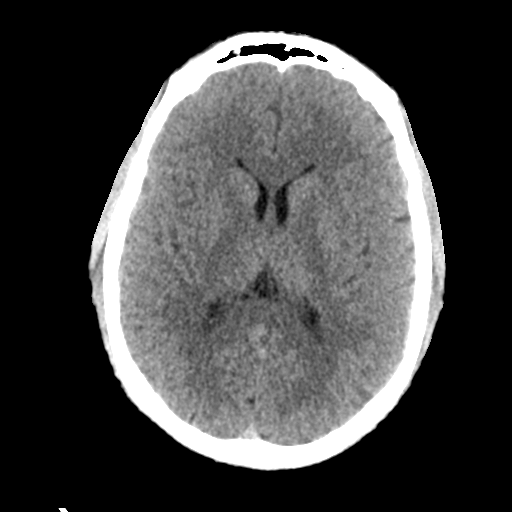
[im 21/33  brain]
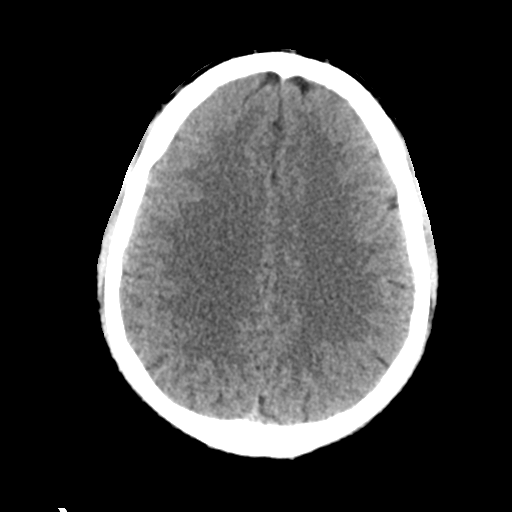
[im 21/33  bone]
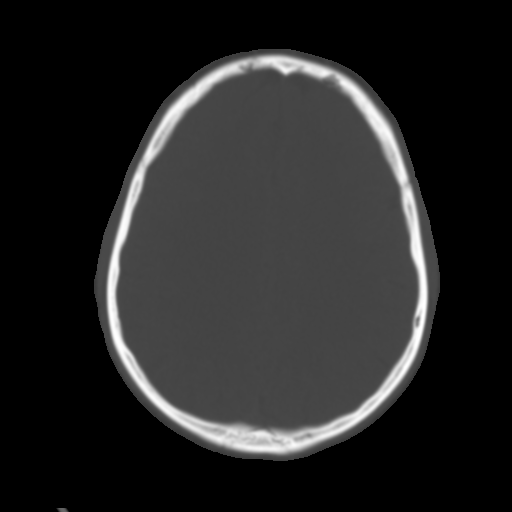
[im 25/33  brain]
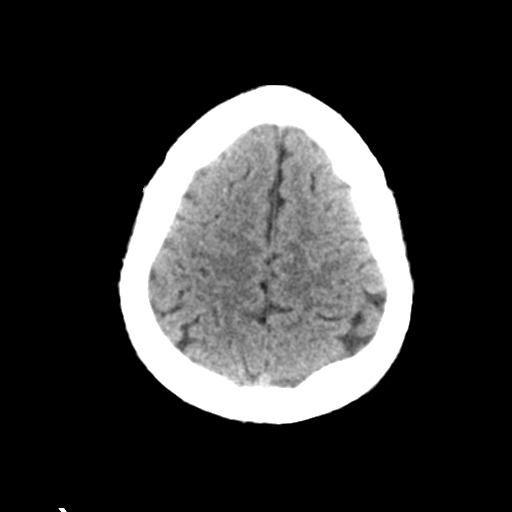
[im 29/33  brain]
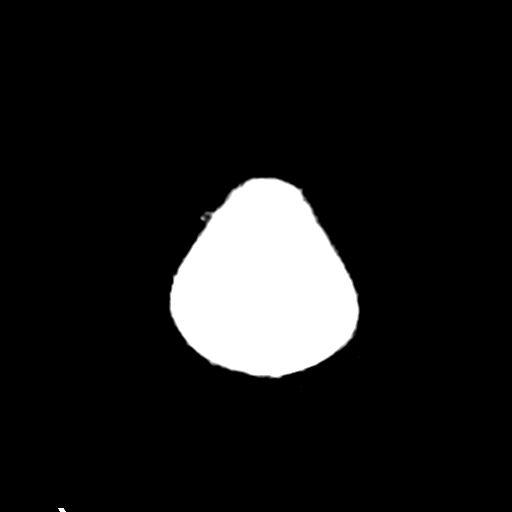

[Series 4: head bone · axial · 0.42mm/px · z∈[-118,-102]mm · 2 of 83 slices shown]
[im 9/83  bone]
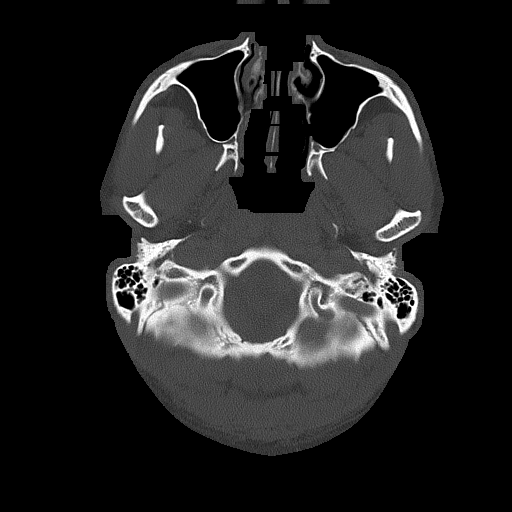
[im 17/83  bone]
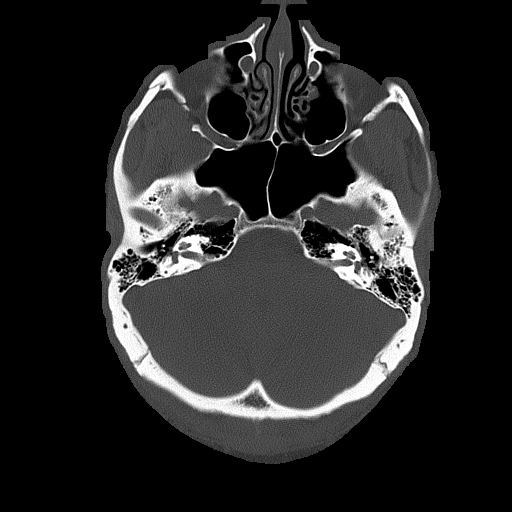

[Series 5: head without cor · coronal · non-contrast · 0.29mm/px · 3 of 72 slices shown]
[im 24/72  brain]
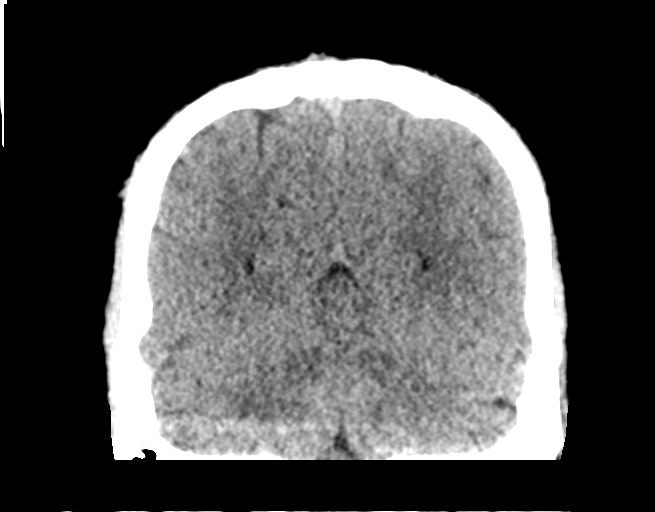
[im 32/72  brain]
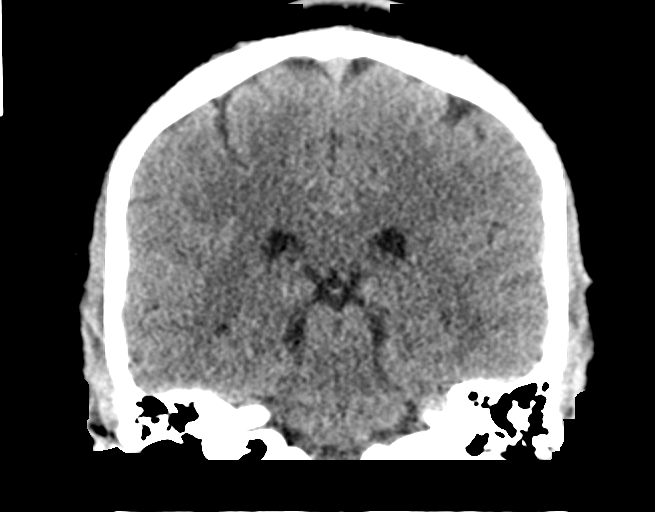
[im 40/72  brain]
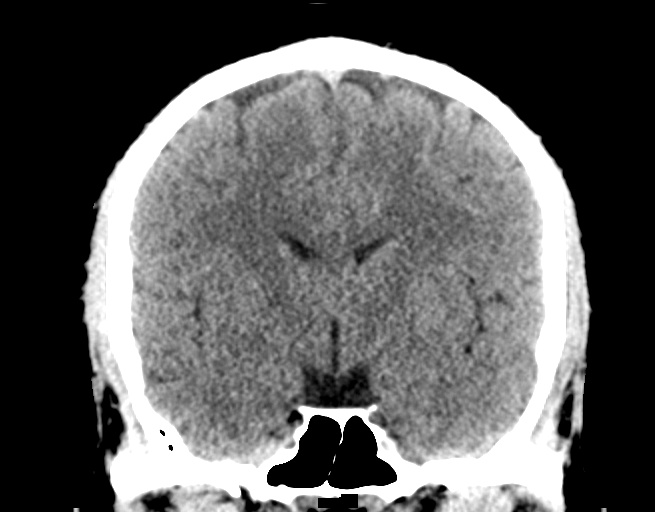

[Series 6: head without sag · sagittal · non-contrast · 0.29mm/px · 3 of 67 slices shown]
[im 23/67  brain]
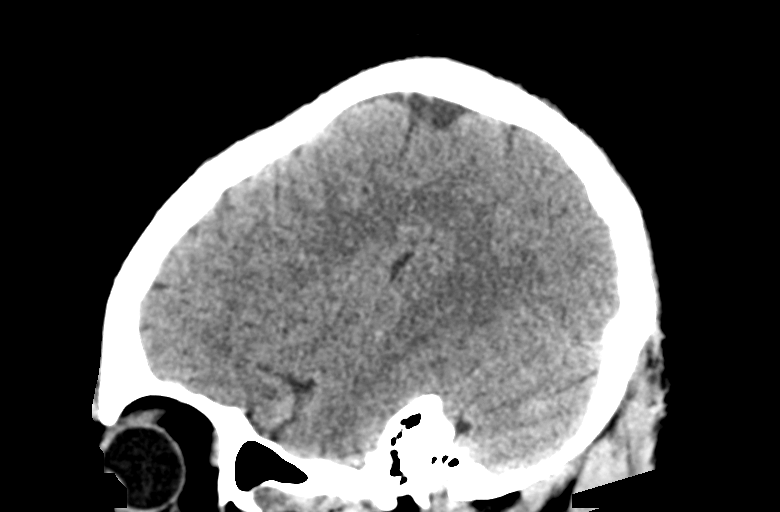
[im 34/67  brain]
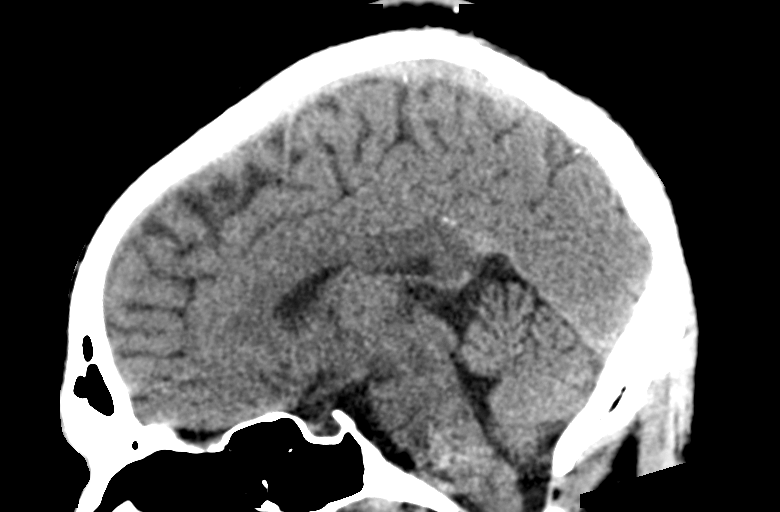
[im 45/67  brain]
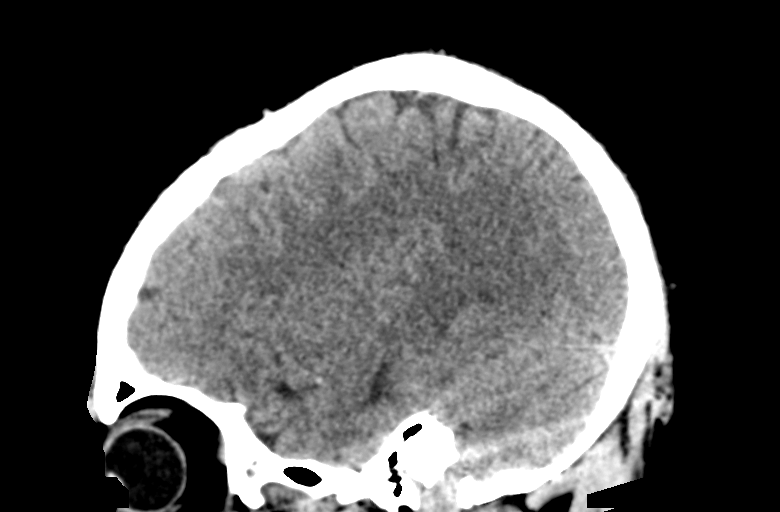

[15 of 47 positions shown; findings below may reference images not displayed]

FINDINGS: Brain: There is no mass, hemorrhage or extra-axial collection. The
size and configuration of the ventricles and extra-axial CSF spaces
are normal. The brain parenchyma is normal, without acute or chronic
infarction.

Vascular: No abnormal hyperdensity of the major intracranial
arteries or dural venous sinuses. No intracranial atherosclerosis.

Skull: The visualized skull base, calvarium and extracranial soft
tissues are normal.

Sinuses/Orbits: No fluid levels or advanced mucosal thickening of
the visualized paranasal sinuses. No mastoid or middle ear effusion.
The orbits are normal.
IMPRESSION: Normal head CT.

## 2021-12-03 ENCOUNTER — Encounter: Payer: Self-pay | Admitting: Gastroenterology

## 2022-01-06 ENCOUNTER — Ambulatory Visit: Payer: BLUE CROSS/BLUE SHIELD | Admitting: Gastroenterology

## 2022-02-16 ENCOUNTER — Ambulatory Visit: Payer: BLUE CROSS/BLUE SHIELD | Admitting: Gastroenterology
